# Patient Record
Sex: Female | Born: 1970
Health system: Southern US, Community
[De-identification: ages and names within clinical notes are randomized; demographics above are authoritative.]

## PROBLEM LIST (undated history)

## (undated) DIAGNOSIS — Z87440 Personal history of urinary (tract) infections: Secondary | ICD-10-CM

## (undated) DIAGNOSIS — K219 Gastro-esophageal reflux disease without esophagitis: Secondary | ICD-10-CM

## (undated) DIAGNOSIS — Z8619 Personal history of other infectious and parasitic diseases: Secondary | ICD-10-CM

## (undated) DIAGNOSIS — R011 Cardiac murmur, unspecified: Secondary | ICD-10-CM

## (undated) DIAGNOSIS — Z114 Encounter for screening for human immunodeficiency virus [HIV]: Secondary | ICD-10-CM

## (undated) DIAGNOSIS — Z9889 Other specified postprocedural states: Secondary | ICD-10-CM

## (undated) DIAGNOSIS — R112 Nausea with vomiting, unspecified: Secondary | ICD-10-CM

## (undated) DIAGNOSIS — M1990 Unspecified osteoarthritis, unspecified site: Secondary | ICD-10-CM

## (undated) DIAGNOSIS — T7840XA Allergy, unspecified, initial encounter: Secondary | ICD-10-CM

## (undated) HISTORY — PX: TONSILLECTOMY: SUR1361

## (undated) HISTORY — DX: Encounter for screening for human immunodeficiency virus (HIV): Z11.4

## (undated) HISTORY — DX: Gastro-esophageal reflux disease without esophagitis: K21.9

## (undated) HISTORY — PX: BREAST SURGERY: SHX581

## (undated) HISTORY — DX: Personal history of other infectious and parasitic diseases: Z86.19

## (undated) HISTORY — PX: MANDIBLE FRACTURE SURGERY: SHX706

## (undated) HISTORY — DX: Allergy, unspecified, initial encounter: T78.40XA

## (undated) HISTORY — DX: Personal history of urinary (tract) infections: Z87.440

## (undated) HISTORY — DX: Cardiac murmur, unspecified: R01.1

---

## 1998-08-07 HISTORY — PX: ENDOMETRIAL BIOPSY: SHX622

## 2007-04-08 ENCOUNTER — Ambulatory Visit (HOSPITAL_COMMUNITY): Admission: RE | Admit: 2007-04-08 | Discharge: 2007-04-08 | Payer: Self-pay | Admitting: Obstetrics and Gynecology

## 2008-05-31 ENCOUNTER — Encounter (INDEPENDENT_AMBULATORY_CARE_PROVIDER_SITE_OTHER): Payer: Self-pay | Admitting: Obstetrics and Gynecology

## 2008-05-31 ENCOUNTER — Ambulatory Visit (HOSPITAL_COMMUNITY): Admission: RE | Admit: 2008-05-31 | Discharge: 2008-05-31 | Payer: Self-pay | Admitting: Obstetrics and Gynecology

## 2009-02-06 HISTORY — PX: DILATION AND CURETTAGE OF UTERUS: SHX78

## 2010-01-27 ENCOUNTER — Encounter: Payer: Self-pay | Admitting: Obstetrics and Gynecology

## 2010-04-16 LAB — CBC
HCT: 40.3 % (ref 36.0–46.0)
Platelets: 277 10*3/uL (ref 150–400)
RDW: 12.3 % (ref 11.5–15.5)

## 2010-04-16 LAB — PREGNANCY, URINE: Preg Test, Ur: NEGATIVE

## 2010-05-21 NOTE — H&P (Signed)
[name], [name] NO.:  [id number]   MEDICAL RECORD NO.:  [id number]         PATIENT TYPE:  WAMB   LOCATION:                                FACILITY:  [facility]   PHYSICIAN:  Duke Salvia. Marcelle Overlie, M.D.DATE OF BIRTH:  August 13, 1970   DATE OF ADMISSION:  05/31/2008  DATE OF DISCHARGE:                              HISTORY & PHYSICAL   CHIEF COMPLAINT:  Pelvic pain, endometrial polyp.   HISTORY OF PRESENT ILLNESS:  A 40 year old G0, P0.  The patient was  referred from [name] where he had been evaluating her for  primary infertility.  His saline infusion ultrasound demonstrated an  endometrial polyp.  Additionally, she has pelvic pain and dyspareunia.  She has a family history with a sister with endometriosis.  She presents  now for D and C, hysteroscopy with diagnostic laparoscopy.  This  procedure including risks related to bleeding, infection, adjacent organ  injury, the possible need for open additional surgery all reviewed.   PAST MEDICAL HISTORY:   ALLERGIES:  SULFA.   OPERATIONS:  Breast augmentation, jaw surgery.   REVIEW OF SYSTEMS:  Significant for primary infertility, prior history  of Chlamydia treated in the past, and UTI.   FAMILY HISTORY:  Significant for arthritis, unspecified cancer, IVF, and  heart disease.   SOCIAL HISTORY:  Denies smoking or drug use.  One drink per day.  She is  married.   PHYSICAL EXAMINATION:  VITAL SIGNS:  Temperature 98.2, blood pressure  120/72.  HEENT:  Unremarkable.  NECK:  Supple without masses.  LUNGS:  Clear.  CARDIOVASCULAR:  Regular rate and rhythm without murmurs, rubs, or  gallops.  BREASTS:  Without masses.  ABDOMEN: Soft, flat, nontender.  PELVIC:  Normal external genitalia.  Vagina and cervix are clear.  Uterus midposition, normal size.  Adnexa negative.  No unusual  nodularity or tenderness.   IMPRESSION:  1. Endometrial polyp.  2. Pelvic pain, dyspareunia, rule out endometriosis.   PLAN:   D and C, hysteroscopy with diagnostic laparoscopy.  Procedure and  risks reviewed as above.      Richard M. Marcelle Overlie, M.D.  Electronically Signed     RMH/MEDQ  D:  05/26/2008  T:  05/27/2008  Job:  [id number]

## 2010-05-21 NOTE — Op Note (Signed)
[name], [name]             ACCOUNT NO.:  [id number]   MEDICAL RECORD NO.:  [id number]          PATIENT TYPE:  AMB   LOCATION:  SDC                           [facility]ACILITY:  [facility]   PHYSICIAN:  Duke Salvia. Marcelle Overlie, M.D.DATE OF BIRTH:  August 16, 1970   DATE OF PROCEDURE:  05/31/2008  DATE OF DISCHARGE:  05/31/2008                               OPERATIVE REPORT   PREOPERATIVE DIAGNOSES:  Abnormal uterine bleeding, endometrial polyp,  pelvic pain, primary infertility.   POSTOPERATIVE DIAGNOSES:  Abnormal uterine bleeding, endometrial polyp,  pelvic pain, primary infertility plus pelvic endometriosis.   PROCEDURE:  Dilation and curettage, hysteroscopy with removal of  endometrial polyp, tubal dye insufflation, diagnostic laparoscopy with  superficial coagulation of endometriosis.   SURGEON:  Duke Salvia. Marcelle Overlie, MD   ANESTHESIA:  General endotracheal.   COMPLICATIONS:  None.   DRAINS:  In-and-out Foley catheter.   BLOOD LOSS:  Minimal.   SPECIMENS:  Endometrial curettings.   PROCEDURE AND FINDINGS:  The patient taken to the operating room after  an adequate level of general endotracheal anesthesia was obtained with  the patient's legs in stirrups.  The abdomen, perineum, and vagina were  prepped and draped in usual manner for laparoscopy D and C.  Bladder was  drained.  EUA carried out.  Uterus midposition, normal size.  Adnexa  negative.  Weighted speculum was positioned with the legs extended.  The  anterior cervix was grasped with tenaculum, sounded to 8 cm,  progressively dilated to a 27 Pratt dilator.  The 4.5-mm diagnostic  hysteroscope was then used with continuous flow to perform hysteroscopy  revealing several endometrial polyps and a moderate amount of  endometrial tissue.  Sharp curettage was carried out along with  exploration with polyp forceps removing some polypoid-like tissue.  The  scope was reinserted.  The cavity was irrigated and noted to be clean.  The specimen  was sent to pathology.  Kahn cannula was positioned.  Attention then directed to the abdomen.   The subumbilical area was infiltrated with 0.25% Marcaine plain.  The  Veress needle was introduced without difficulty.  Its intra-abdominal  position was verified by pressure and water testing.  After 2-1/2 L  pneumoperitoneum was then created laparoscopic trocar and sleeve were  then introduced without difficulty.  There was no evidence of any  bleeding or trauma.  Three fingerbreadths above the symphysis in  midline.  A 5-mm trocar was inserted under direct visualization.  She  was placed in Trendelenburg and pelvic findings as follows.   The uterus itself was upper limit of normal size, normal serosal  contour.  The anterior bladder flap was unremarkable.  Right tube and  ovary were normal.  The left tube was normal with a delicate fimbriated  end on each side.   Remarkable findings were active pelvic endometriosis, blackish bluish  areas with inflammation on the posterior surface of the left ovary on  the left ovarian peritoneal fossa and just lateral to the uterosacral  ligament on the left side.  The right side was free and clear.  The  remainder of the cul-de-sac was  free and clear.  The appendix could not  be well identified, but there were no other upper abdominal findings.  Tubal dye studies were carried out prompt spillage on each side.  The  Nezhat was then used to irrigate and aspirate the fluid.  The bipolar  was then used to superficially coagulate the areas of endometriosis on  the posterior surface of the ovary in particular and superficially on  the implant just lateral to the uterosacral ligament.  This was done  superficially since it was near the course of the ureter which was  traced and felt to be below.  But again, this was superficial  coagulation only.  Nezhat was then used to remove the debris and  aspirated.  All operative sites were hemostatic.  She tolerated  this  well, went to recovery room in good condition.  After the instruments  were removed, gas allowed to escape.  Defects were closed with 4 Dexon  subcuticular sutures and Dermabond.      Richard M. Marcelle Overlie, M.D.  Electronically Signed     RMH/MEDQ  D:  05/31/2008  T:  06/01/2008  Job:  [id number]   cc:   Leatha Gilding. Mezer, M.D.  Fax: [phone]

## 2013-03-31 ENCOUNTER — Other Ambulatory Visit: Payer: Self-pay | Admitting: Obstetrics and Gynecology

## 2013-03-31 DIAGNOSIS — R928 Other abnormal and inconclusive findings on diagnostic imaging of breast: Secondary | ICD-10-CM

## 2013-04-08 ENCOUNTER — Ambulatory Visit
Admission: RE | Admit: 2013-04-08 | Discharge: 2013-04-08 | Disposition: A | Discharge status: Home / Self Care | Payer: BC Managed Care – PPO | Source: Ambulatory Visit | Attending: Obstetrics and Gynecology | Admitting: Obstetrics and Gynecology

## 2013-04-08 ENCOUNTER — Other Ambulatory Visit: Payer: Self-pay | Admitting: Obstetrics and Gynecology

## 2013-04-08 DIAGNOSIS — R928 Other abnormal and inconclusive findings on diagnostic imaging of breast: Secondary | ICD-10-CM

## 2013-04-08 DIAGNOSIS — R921 Mammographic calcification found on diagnostic imaging of breast: Secondary | ICD-10-CM

## 2013-04-15 ENCOUNTER — Ambulatory Visit
Admission: RE | Admit: 2013-04-15 | Discharge: 2013-04-15 | Disposition: A | Discharge status: Home / Self Care | Payer: BC Managed Care – PPO | Source: Ambulatory Visit | Attending: Obstetrics and Gynecology | Admitting: Obstetrics and Gynecology

## 2013-05-02 ENCOUNTER — Ambulatory Visit (INDEPENDENT_AMBULATORY_CARE_PROVIDER_SITE_OTHER): Payer: BC Managed Care – PPO | Admitting: General Surgery

## 2013-05-02 ENCOUNTER — Encounter (INDEPENDENT_AMBULATORY_CARE_PROVIDER_SITE_OTHER): Payer: Self-pay | Admitting: General Surgery

## 2013-05-02 VITALS — BP 116/78 | HR 80 | Temp 98.0°F | Resp 12 | Ht 68.0 in | Wt 145.0 lb

## 2013-05-02 DIAGNOSIS — R928 Other abnormal and inconclusive findings on diagnostic imaging of breast: Secondary | ICD-10-CM | POA: Insufficient documentation

## 2013-05-02 NOTE — Patient Instructions (Signed)
Your recent image guided biopsy of the left breast shows atypical cells, but not cancer.  We recommend that this area be conservatively excised to make sure that we're not missing an early breast cancer. Hopefully this is not the case.  You will be scheduled for a left lumpectomy with radioactive seed localization in the near future.       Lumpectomy A lumpectomy is a form of "breast conserving" or "breast preservation" surgery. It may also be referred to as a partial mastectomy. During a lumpectomy, the portion of the breast that contains the cancerous tumor or breast mass (the lump) is removed. Some normal tissue around the lump may also be removed to make sure all the tumor has been removed. This surgery should take 40 minutes or less. LET [facility] PROVID[facility] KNOW ABOUT:  Any allergies you have.  All medicines you are taking, including vitamins, herbs, eye drops, creams, and over-the-counter medicines.  Previous problems you or members of your family have had with the use of anesthetics.  Any blood disorders you have.  Previous surgeries you have had.  Medical conditions you have. RISKS AND COMPLICATIONS Generally, this is a safe procedure. However, as with any procedure, complications can occur. Possible complications include:  Bleeding.  Infection.  Pain.  Temporary swelling.  Change in the shape of the breast, particularly if a large portion is removed. BEFORE THE PROCEDURE  Ask your health care provider about changing or stopping your regular medicines.  Do not eat or drink anything for 7 8 hours before the surgery or as directed by your health care provider. Ask your health care provider if you can take a sip of water with any approved medicines.  On the day of surgery, your healthcare provider will use a mammogram or ultrasound to locate and mark the tumor in your breast. These markings on your breast will show where the cut (incision) will be  made. PROCEDURE   An IV tube will be put into one of your veins.  You may be given medicine to help you relax before the surgery (sedative). You will be given one of the following:  A medicine that numbs the area (local anesthesia).  A medicine that makes you go to sleep (general anesthesia).  Your health care provider will use a kind of electric scalpel that uses heat to minimize bleeding (electrocautery knife).  A curved incision (like a smile or frown) that follows the natural curve of your breast is made, to allow for minimal scarring and better healing.  The tumor will be removed with some of the surrounding tissue. This will be sent to the lab for analysis. Your health care provider may also remove your lymph nodes at this time if needed.  Sometimes, but not always, a rubber tube called a drain will be surgically inserted into your breast area or armpit to collect excess fluid that may accumulate in the space where the tumor was. This drain is connected to a plastic bulb on the outside of your body. This drain creates suction to help remove the fluid.  The incisions will be closed with stitches (sutures).  A bandage may be placed over the incisions. AFTER THE PROCEDURE  You will be taken to the recovery area.  You will be given medicine for pain.  A small rubber drain may be placed in the breast for 2 3 days to prevent a collection of blood (hematoma) from developing in the breast. You will be given instructions on  caring for the drain before you go home.  A pressure bandage (dressing) will be applied for 1 2 days to prevent bleeding. Ask your health care provider how to care for your bandage at home. Document Released: 02/03/2006 Document Revised: 08/25/2012 Document Reviewed: 05/28/2012 [facility] Patient Information 2014 [place].

## 2013-05-02 NOTE — Progress Notes (Signed)
Patient ID: Rhonda CohoJanell Fountain, female   DOB: 12/13/1970, 43 y.o.   MRN: [id number]  Chief Complaint  Patient presents with  . New Evaluation    eval Lt br atypica    HPI [name] is a 43 y.o. female.  She [facility] left breast, medial quadrant, with image guided biopsy showing flat epithelial atypia.  The patient has no prior history of breast problems she has had bilateral saline implants placed. She says these are subpectoral and [place]. She's had no problems and they been there for almost 15 years. She is followed by Dr. Chevis PrettyMezer. Bilateral mammograms were performed at his office recently. They show a new 7 mm area of suspicious calcifications in the left breast medially. Image guided biopsy shows flat epithelial atypia, reviewed by Dr. Frederica Kusterund and Dr. Colonel BaldKish.  The images are not retrievable on the computer today.  History reveals breast cancer paternal aunt age 44 with relapse and eventual death. Breast cancer in 2 great maternal aunts, postmenopausal no ovarian cancer.  Comorbidities are minimal. She quit smoking 2005. She is here with her husband today.  HPI  History reviewed. No pertinent past medical history.  Past Surgical History  Procedure Laterality Date  . Breast surgery      breast augmentation 08/1998  . Dilation and curettage of uterus  02/2009  . Endometrial biopsy  08/1998    Family History  Problem Relation Age of Onset  . Cancer Maternal Aunt     breast  . Cancer Paternal Aunt     breast  . Birth defects Paternal Grandfather     lung    Social History History  Substance Use Topics  . Smoking status: Former Smoker    Quit date: 05/03/2003  . Smokeless tobacco: Not on file  . Alcohol Use: Yes     Comment: social    Allergies  Allergen Reactions  . Sulfa Antibiotics Hives    Current Outpatient Prescriptions  Medication Sig Dispense Refill  . fexofenadine-pseudoephedrine  (ALLEGRA-D 24) 180-240 MG per 24 hr tablet Take 1 tablet by mouth daily.       No current facility-administered medications for this visit.    Review of Systems Review of Systems  Constitutional: Negative for fever, chills and unexpected weight change.  HENT: Negative for congestion, hearing loss, sore throat, trouble swallowing and voice change.   Eyes: Negative for visual disturbance.  Respiratory: Negative for cough and wheezing.   Cardiovascular: Negative for chest pain, palpitations and leg swelling.  Gastrointestinal: Negative for nausea, vomiting, abdominal pain, diarrhea, constipation, blood in stool, abdominal distention and anal bleeding.  Genitourinary: Negative for hematuria, vaginal bleeding and difficulty urinating.  Musculoskeletal: Negative for arthralgias.  Skin: Negative for rash and wound.  Neurological: Negative for seizures, syncope and headaches.  Hematological: Negative for adenopathy. Does not bruise/bleed easily.  Psychiatric/Behavioral: Negative for confusion.    Blood pressure 116/78, pulse 80, temperature 98 F (36.7 C), temperature source Temporal, resp. rate 12, height 5\' 8"  (1.727 m), weight 145 lb (65.772 kg), last menstrual period 03/12/2013.  Physical Exam Physical Exam  Constitutional: She is oriented to person, place, and time. She appears well-developed and well-nourished. No distress.  Alert. Thin. Fit.  HENT:  Head: Normocephalic and atraumatic.  Nose: Nose normal.  Mouth/Throat: No oropharyngeal exudate.  Eyes: Conjunctivae and EOM are normal. Pupils are equal, round, and reactive to light. Left eye exhibits no discharge.  No scleral icterus.  Neck: Neck supple. No JVD present. No tracheal deviation present. No thyromegaly present.  Cardiovascular: Normal rate, regular rhythm, normal heart sounds and intact distal pulses.   No murmur heard. Pulmonary/Chest: Effort normal and breath sounds normal. No respiratory distress. She has no wheezes.  She has no rales. She exhibits no tenderness.    Bilateral circumareolar incisions well healed. Bilateral implants in place. Small biopsy site left breast no. No hematoma. No palpable mass. No axillary adenopathy.  Abdominal: Soft. Bowel sounds are normal. She exhibits no distension and no mass. There is no tenderness. There is no rebound and no guarding.  Musculoskeletal: She exhibits no edema and no tenderness.  Lymphadenopathy:    She has no cervical adenopathy.  Neurological: She is alert and oriented to person, place, and time. She exhibits normal muscle tone. Coordination normal.  Skin: Skin is warm. No rash noted. She is not diaphoretic. No erythema. No pallor.  Psychiatric: She has a normal mood and affect. Her behavior is normal. Judgment and thought content normal.    Data Reviewed Mammograms and pathology report  Assessment    Abnormal mammogram left breast, medial, 7 mm calcifications. Atypical cells via biopsy.  Status post bilateral subpectoral implants by history  Second and third line relatives with breast cancer     Plan    Scheduled for conservative left lumpectomy with radioactive seed localization in the near future  I discussed the indications, details, techniques, and numerous risk of the surgery with the patient and her husband. They're aware of the risk of bleeding, infection, cosmetic deformity, injury to the implant requiring sacrifice, reoperation if this is cancer, and other unforeseen problems. She is aware of these issues. She understands all these issues. All their questions were answered. They both agree with this plan.        Angelia MouldHaywood M. Derrell LollingIngram, M.D., [facility] General and Minimally invasive Surgery Breast and Colorectal Surgery Office:   [phone] Pager:   [phone]  05/02/2013, 5:40 PM

## 2013-05-03 ENCOUNTER — Other Ambulatory Visit (INDEPENDENT_AMBULATORY_CARE_PROVIDER_SITE_OTHER): Payer: Self-pay | Admitting: General Surgery

## 2013-05-03 DIAGNOSIS — R928 Other abnormal and inconclusive findings on diagnostic imaging of breast: Secondary | ICD-10-CM

## 2013-05-10 NOTE — H&P (Signed)
Rhonda CohoJanell Tortorella   MRN:  [id number]   Description: 43 year old female  Provider: Ernestene MentionHaywood M Abrea Henle, MD  Department: [facility]              Diagnoses      Abnormal mammogram    -  Primary      793.80             Reason for Visit      New Evaluation      eval Lt br atypica               Current Vitals Most recent update: 05/02/2013  4:57 PM by Wynelle Clevelandonya Ayers, CMA      BP Pulse Temp(Src) Resp Ht Wt      116/78 80 98 F (36.7 C) (Temporal) 12 5\' 8"  (1.727 m) 145 lb (65.772 kg)      BMI 22.05 kg/m2 03/12/2013                    History and Physical    Ernestene MentionHaywood M Gustin Zobrist, MD      Status: Signed            Patient ID: Rhonda CohoJanell Venditto, female   DOB: 04/13/1970, 43 y.o.   MRN: [id number]            Note: This dictation was prepared with Dragon/digital dictation along with [facility]phrase technology. Any transcriptional errors that result from this process are unintentional.    HPI [name] is a 43 y.o. female.  [facility] left breast, medial quadrant, with image guided biopsy showing flat epithelial atypia.   The patient has no prior history of breast problems she has had bilateral saline implants placed. She says these are subpectoral and they were done in [place]. She's had no problems and they been there for almost 15 years. She is followed by Dr. Chevis PrettyMezer. Bilateral mammograms were performed at his office recently. They show a new 7 mm area of suspicious calcifications in the left breast medially. Image guided biopsy shows flat epithelial atypia, reviewed by Dr. Frederica Kusterund and Dr. Colonel BaldKish.  The images are not retrievable on the computer today.   History reveals breast cancer paternal aunt age 44 with relapse and eventual death. Breast cancer in 2 great maternal aunts, postmenopausal no ovarian cancer.   Comorbidities are minimal. She quit smoking 2005. She is here with her husband today.        History  reviewed. No pertinent past medical history.    Past Surgical History   Procedure  Laterality  Date   .  Breast surgery           breast augmentation 08/1998   .  Dilation and curettage of uterus    02/2009   .  Endometrial biopsy    08/1998         Family History   Problem  Relation  Age of Onset   .  Cancer  Maternal Aunt         breast   .  Cancer  Paternal Aunt         breast   .  Birth defects  Paternal Grandfather         lung        Social History History   Substance Use Topics   .  Smoking status:  Former Smoker       Quit date:  05/03/2003   .  Smokeless tobacco:  Not on file   .  Alcohol Use:  Yes         Comment: social         Allergies   Allergen  Reactions   .  Sulfa Antibiotics  Hives         Current Outpatient Prescriptions   Medication  Sig  Dispense  Refill   .  fexofenadine-pseudoephedrine (ALLEGRA-D 24) 180-240 MG per 24 hr tablet  Take 1 tablet by mouth daily.             No current facility-administered medications for this visit.        Review of Systems   Constitutional: Negative for fever, chills and unexpected weight change.  HENT: Negative for congestion, hearing loss, sore throat, trouble swallowing and voice change.   Eyes: Negative for visual disturbance.  Respiratory: Negative for cough and wheezing.   Cardiovascular: Negative for chest pain, palpitations and leg swelling.  Gastrointestinal: Negative for nausea, vomiting, abdominal pain, diarrhea, constipation, blood in stool, abdominal distention and anal bleeding.  Genitourinary: Negative for hematuria, vaginal bleeding and difficulty urinating.  Musculoskeletal: Negative for arthralgias.  Skin: Negative for rash and wound.  Neurological: Negative for seizures, syncope and headaches.  Hematological: Negative for adenopathy. Does not bruise/bleed easily.  Psychiatric/Behavioral: Negative for confusion.      Blood pressure 116/78, pulse 80, temperature 98 F (36.7  C), temperature source Temporal, resp. rate 12, height 5\' 8"  (1.727 m), weight 145 lb (65.772 kg), last menstrual period 03/12/2013.   Physical Exam   Constitutional: She is oriented to person, place, and time. She appears well-developed and well-nourished. No distress.  Alert. Thin. Fit.  HENT:   Head: Normocephalic and atraumatic.   Nose: Nose normal.   Mouth/Throat: No oropharyngeal exudate.  Eyes: Conjunctivae and EOM are normal. Pupils are equal, round, and reactive to light. Left eye exhibits no discharge. No scleral icterus.  Neck: Neck supple. No JVD present. No tracheal deviation present. No thyromegaly present.  Cardiovascular: Normal rate, regular rhythm, normal heart sounds and intact distal pulses.    No murmur heard. Pulmonary/Chest: Effort normal and breath sounds normal. No respiratory distress. She has no wheezes. She has no rales. She exhibits no tenderness.    Bilateral circumareolar incisions well healed. Bilateral implants in place. Small biopsy site left breast no. No hematoma. No palpable mass. No axillary adenopathy.  Abdominal: Soft. Bowel sounds are normal. She exhibits no distension and no mass. There is no tenderness. There is no rebound and no guarding.  Musculoskeletal: She exhibits no edema and no tenderness.  Lymphadenopathy:    She has no cervical adenopathy.  Neurological: She is alert and oriented to person, place, and time. She exhibits normal muscle tone. Coordination normal.  Skin: Skin is warm. No rash noted. She is not diaphoretic. No erythema. No pallor.  Psychiatric: She has a normal mood and affect. Her behavior is normal. Judgment and thought content normal.      Data Reviewed Mammograms and pathology report   Assessment    Abnormal mammogram left breast, medial, 7 mm calcifications. Atypical cells via biopsy.   Status post bilateral subpectoral implants by history   Second and third line relatives with breast cancer       Plan    Scheduled for conservative left lumpectomy with radioactive seed localization in the near future   I discussed the indications, details, techniques, and numerous risk of the surgery with  the patient and her husband. They're aware of the risk of bleeding, infection, cosmetic deformity, injury to the implant requiring sacrifice, reoperation if this is cancer, and other unforeseen problems. She is aware of these issues. She understands all these issues. All their questions were answered. They both agree with this plan.         Angelia MouldHaywood M. Derrell LollingIngram, M.D., [facility] General and Minimally invasive Surgery Breast and Colorectal Surgery Office:   [phone] Pager:   [phone]

## 2013-05-11 ENCOUNTER — Ambulatory Visit
Admission: RE | Admit: 2013-05-11 | Discharge: 2013-05-11 | Disposition: A | Discharge status: Home / Self Care | Payer: BC Managed Care – PPO | Source: Ambulatory Visit | Attending: General Surgery | Admitting: General Surgery

## 2013-05-11 ENCOUNTER — Encounter (HOSPITAL_BASED_OUTPATIENT_CLINIC_OR_DEPARTMENT_OTHER): Payer: Self-pay | Admitting: *Deleted

## 2013-05-11 DIAGNOSIS — R928 Other abnormal and inconclusive findings on diagnostic imaging of breast: Secondary | ICD-10-CM

## 2013-05-13 ENCOUNTER — Ambulatory Visit
Admission: RE | Admit: 2013-05-13 | Discharge: 2013-05-13 | Disposition: A | Discharge status: Home / Self Care | Payer: BC Managed Care – PPO | Source: Ambulatory Visit | Attending: General Surgery | Admitting: General Surgery

## 2013-05-13 ENCOUNTER — Encounter (HOSPITAL_BASED_OUTPATIENT_CLINIC_OR_DEPARTMENT_OTHER): Payer: Self-pay | Admitting: *Deleted

## 2013-05-13 ENCOUNTER — Encounter (HOSPITAL_BASED_OUTPATIENT_CLINIC_OR_DEPARTMENT_OTHER): Payer: BC Managed Care – PPO | Admitting: Anesthesiology

## 2013-05-13 ENCOUNTER — Ambulatory Visit (HOSPITAL_BASED_OUTPATIENT_CLINIC_OR_DEPARTMENT_OTHER): Payer: BC Managed Care – PPO | Admitting: Anesthesiology

## 2013-05-13 ENCOUNTER — Encounter (HOSPITAL_BASED_OUTPATIENT_CLINIC_OR_DEPARTMENT_OTHER)
Admission: RE | Disposition: A | Discharge status: Home / Self Care | Payer: Self-pay | Source: Ambulatory Visit | Attending: General Surgery

## 2013-05-13 ENCOUNTER — Ambulatory Visit (HOSPITAL_BASED_OUTPATIENT_CLINIC_OR_DEPARTMENT_OTHER)
Admission: RE | Admit: 2013-05-13 | Discharge: 2013-05-13 | Disposition: A | Discharge status: Home / Self Care | Payer: BC Managed Care – PPO | Source: Ambulatory Visit | Attending: General Surgery | Admitting: General Surgery

## 2013-05-13 DIAGNOSIS — Z882 Allergy status to sulfonamides status: Secondary | ICD-10-CM | POA: Insufficient documentation

## 2013-05-13 DIAGNOSIS — R928 Other abnormal and inconclusive findings on diagnostic imaging of breast: Secondary | ICD-10-CM

## 2013-05-13 DIAGNOSIS — Z87891 Personal history of nicotine dependence: Secondary | ICD-10-CM | POA: Insufficient documentation

## 2013-05-13 DIAGNOSIS — N62 Hypertrophy of breast: Secondary | ICD-10-CM | POA: Insufficient documentation

## 2013-05-13 DIAGNOSIS — R920 Mammographic microcalcification found on diagnostic imaging of breast: Secondary | ICD-10-CM | POA: Insufficient documentation

## 2013-05-13 DIAGNOSIS — Z803 Family history of malignant neoplasm of breast: Secondary | ICD-10-CM | POA: Insufficient documentation

## 2013-05-13 HISTORY — PX: BREAST LUMPECTOMY WITH RADIOACTIVE SEED LOCALIZATION: SHX6424

## 2013-05-13 HISTORY — DX: Other specified postprocedural states: R11.2

## 2013-05-13 HISTORY — DX: Other specified postprocedural states: Z98.890

## 2013-05-13 HISTORY — DX: Unspecified osteoarthritis, unspecified site: M19.90

## 2013-05-13 LAB — POCT HEMOGLOBIN-HEMACUE: Hemoglobin: 14.8 g/dL (ref 12.0–15.0)

## 2013-05-13 SURGERY — BREAST LUMPECTOMY WITH RADIOACTIVE SEED LOCALIZATION
Anesthesia: General | Site: Breast | Laterality: Left

## 2013-05-13 MED ORDER — CEFAZOLIN SODIUM-DEXTROSE 2-3 GM-% IV SOLR
INTRAVENOUS | Status: AC
  Filled 2013-05-13: qty 50

## 2013-05-13 MED ORDER — FENTANYL CITRATE 0.05 MG/ML IJ SOLN: 50.0000 ug | INTRAMUSCULAR | Status: DC | PRN

## 2013-05-13 MED ORDER — SCOPOLAMINE 1 MG/3DAYS TD PT72
MEDICATED_PATCH | TRANSDERMAL | Status: AC
  Filled 2013-05-13: qty 1

## 2013-05-13 MED ORDER — OXYCODONE HCL 5 MG/5ML PO SOLN: 5.0000 mg | Freq: Once | ORAL | Status: DC | PRN

## 2013-05-13 MED ORDER — FENTANYL CITRATE 0.05 MG/ML IJ SOLN
INTRAMUSCULAR | Status: AC
  Filled 2013-05-13: qty 6

## 2013-05-13 MED ORDER — LIDOCAINE HCL (CARDIAC) 20 MG/ML IV SOLN
INTRAVENOUS | Status: DC | PRN
  Administered 2013-05-13: 75 mg via INTRAVENOUS

## 2013-05-13 MED ORDER — MIDAZOLAM HCL 2 MG/2ML IJ SOLN
INTRAMUSCULAR | Status: AC
  Filled 2013-05-13: qty 2

## 2013-05-13 MED ORDER — MIDAZOLAM HCL 5 MG/5ML IJ SOLN
INTRAMUSCULAR | Status: DC | PRN
  Administered 2013-05-13: 2 mg via INTRAVENOUS

## 2013-05-13 MED ORDER — IBUPROFEN 600 MG PO TABS
600.0000 mg | ORAL_TABLET | Freq: Once | ORAL | Status: AC
  Administered 2013-05-13: 600 mg via ORAL

## 2013-05-13 MED ORDER — CHLORHEXIDINE GLUCONATE 4 % EX LIQD: 1.0000 "application " | Freq: Once | CUTANEOUS | Status: DC

## 2013-05-13 MED ORDER — BUPIVACAINE-EPINEPHRINE (PF) 0.5% -1:200000 IJ SOLN
INTRAMUSCULAR | Status: DC | PRN
  Administered 2013-05-13: 4.5 mL

## 2013-05-13 MED ORDER — LACTATED RINGERS IV SOLN
INTRAVENOUS | Status: DC
  Administered 2013-05-13: 08:00:00 via INTRAVENOUS
  Administered 2013-05-13: 10 mL/h via INTRAVENOUS

## 2013-05-13 MED ORDER — FENTANYL CITRATE 0.05 MG/ML IJ SOLN
INTRAMUSCULAR | Status: AC
  Filled 2013-05-13: qty 2

## 2013-05-13 MED ORDER — IBUPROFEN 200 MG PO TABS
ORAL_TABLET | ORAL | Status: AC
  Filled 2013-05-13: qty 3

## 2013-05-13 MED ORDER — HYDROCODONE-ACETAMINOPHEN 5-325 MG PO TABS: 1.0000 | ORAL_TABLET | Freq: Four times a day (QID) | ORAL | Status: DC | PRN

## 2013-05-13 MED ORDER — ONDANSETRON HCL 4 MG/2ML IJ SOLN
INTRAMUSCULAR | Status: DC | PRN
  Administered 2013-05-13: 4 mg via INTRAVENOUS

## 2013-05-13 MED ORDER — OXYCODONE HCL 5 MG PO TABS: 5.0000 mg | ORAL_TABLET | Freq: Once | ORAL | Status: DC | PRN

## 2013-05-13 MED ORDER — CEFAZOLIN SODIUM-DEXTROSE 2-3 GM-% IV SOLR
2.0000 g | INTRAVENOUS | Status: AC
  Administered 2013-05-13: 2 g via INTRAVENOUS

## 2013-05-13 MED ORDER — MIDAZOLAM HCL 2 MG/2ML IJ SOLN: 1.0000 mg | INTRAMUSCULAR | Status: DC | PRN

## 2013-05-13 MED ORDER — BUPIVACAINE-EPINEPHRINE (PF) 0.5% -1:200000 IJ SOLN
INTRAMUSCULAR | Status: AC
  Filled 2013-05-13: qty 30

## 2013-05-13 MED ORDER — PROPOFOL 10 MG/ML IV BOLUS
INTRAVENOUS | Status: DC | PRN
  Administered 2013-05-13: 200 mg via INTRAVENOUS

## 2013-05-13 MED ORDER — DEXAMETHASONE SODIUM PHOSPHATE 4 MG/ML IJ SOLN
INTRAMUSCULAR | Status: DC | PRN
  Administered 2013-05-13: 10 mg via INTRAVENOUS

## 2013-05-13 MED ORDER — ONDANSETRON HCL 4 MG/2ML IJ SOLN: 4.0000 mg | Freq: Four times a day (QID) | INTRAMUSCULAR | Status: DC | PRN

## 2013-05-13 MED ORDER — FENTANYL CITRATE 0.05 MG/ML IJ SOLN
INTRAMUSCULAR | Status: DC | PRN
  Administered 2013-05-13: 100 ug via INTRAVENOUS

## 2013-05-13 MED ORDER — FENTANYL CITRATE 0.05 MG/ML IJ SOLN
25.0000 ug | INTRAMUSCULAR | Status: DC | PRN
  Administered 2013-05-13: 25 ug via INTRAVENOUS

## 2013-05-13 SURGICAL SUPPLY — 65 items
APPLIER CLIP 9.375 MED OPEN (MISCELLANEOUS) ×3
BENZOIN TINCTURE PRP APPL 2/3 (GAUZE/BANDAGES/DRESSINGS) IMPLANT
BINDER BREAST LRG (GAUZE/BANDAGES/DRESSINGS) IMPLANT
BINDER BREAST MEDIUM (GAUZE/BANDAGES/DRESSINGS) IMPLANT
BINDER BREAST XLRG (GAUZE/BANDAGES/DRESSINGS) IMPLANT
BINDER BREAST XXLRG (GAUZE/BANDAGES/DRESSINGS) IMPLANT
BLADE 10 SAFETY STRL DISP (BLADE) IMPLANT
BLADE 15 SAFETY STRL DISP (BLADE) IMPLANT
BLADE HEX COATED 2.75 (ELECTRODE) ×3 IMPLANT
BLADE SURG 15 STRL LF DISP TIS (BLADE) ×1 IMPLANT
BLADE SURG 15 STRL SS (BLADE) ×2
CANISTER SUC SOCK COL 7IN (MISCELLANEOUS) IMPLANT
CANISTER SUCT 1200ML W/VALVE (MISCELLANEOUS) ×3 IMPLANT
CHLORAPREP W/TINT 26ML (MISCELLANEOUS) ×3 IMPLANT
CLIP APPLIE 9.375 MED OPEN (MISCELLANEOUS) ×1 IMPLANT
CLOSURE WOUND 1/2 X4 (GAUZE/BANDAGES/DRESSINGS)
COVER MAYO STAND STRL (DRAPES) ×3 IMPLANT
COVER PROBE W GEL 5X96 (DRAPES) ×3 IMPLANT
COVER TABLE BACK 60X90 (DRAPES) ×3 IMPLANT
DECANTER SPIKE VIAL GLASS SM (MISCELLANEOUS) IMPLANT
DERMABOND ADVANCED (GAUZE/BANDAGES/DRESSINGS) ×2
DERMABOND ADVANCED .7 DNX12 (GAUZE/BANDAGES/DRESSINGS) ×1 IMPLANT
DEVICE DUBIN W/COMP PLATE 8390 (MISCELLANEOUS) ×3 IMPLANT
DRAIN CHANNEL 19F RND (DRAIN) IMPLANT
DRAPE LAPAROSCOPIC ABDOMINAL (DRAPES) ×3 IMPLANT
DRAPE UTILITY XL STRL (DRAPES) ×3 IMPLANT
DRSG PAD ABDOMINAL 8X10 ST (GAUZE/BANDAGES/DRESSINGS) IMPLANT
ELECT REM PT RETURN 9FT ADLT (ELECTROSURGICAL) ×3
ELECTRODE REM PT RTRN 9FT ADLT (ELECTROSURGICAL) ×1 IMPLANT
EVACUATOR SILICONE 100CC (DRAIN) IMPLANT
GLOVE BIOGEL PI IND STRL 6.5 (GLOVE) ×1 IMPLANT
GLOVE BIOGEL PI INDICATOR 6.5 (GLOVE) ×2
GLOVE ECLIPSE 6.5 STRL STRAW (GLOVE) ×3 IMPLANT
GLOVE EUDERMIC 7 POWDERFREE (GLOVE) ×6 IMPLANT
GLOVE EXAM NITRILE EXT CUFF MD (GLOVE) ×3 IMPLANT
GOWN STRL REUS W/ TWL LRG LVL3 (GOWN DISPOSABLE) ×1 IMPLANT
GOWN STRL REUS W/ TWL XL LVL3 (GOWN DISPOSABLE) ×1 IMPLANT
GOWN STRL REUS W/TWL LRG LVL3 (GOWN DISPOSABLE) ×2
GOWN STRL REUS W/TWL XL LVL3 (GOWN DISPOSABLE) ×2
KIT MARKER MARGIN INK (KITS) ×3 IMPLANT
NEEDLE HYPO 25X1 1.5 SAFETY (NEEDLE) ×3 IMPLANT
NS IRRIG 1000ML POUR BTL (IV SOLUTION) ×3 IMPLANT
PACK BASIN DAY SURGERY FS (CUSTOM PROCEDURE TRAY) ×3 IMPLANT
PENCIL BUTTON HOLSTER BLD 10FT (ELECTRODE) ×3 IMPLANT
PIN SAFETY STERILE (MISCELLANEOUS) IMPLANT
SHEET MEDIUM DRAPE 40X70 STRL (DRAPES) IMPLANT
SLEEVE SCD COMPRESS KNEE MED (MISCELLANEOUS) ×3 IMPLANT
SPONGE GAUZE 4X4 12PLY STER LF (GAUZE/BANDAGES/DRESSINGS) IMPLANT
SPONGE LAP 18X18 X RAY DECT (DISPOSABLE) IMPLANT
SPONGE LAP 4X18 X RAY DECT (DISPOSABLE) ×3 IMPLANT
STRIP CLOSURE SKIN 1/2X4 (GAUZE/BANDAGES/DRESSINGS) IMPLANT
SUT ETHILON 3 0 FSL (SUTURE) IMPLANT
SUT MNCRL AB 4-0 PS2 18 (SUTURE) ×3 IMPLANT
SUT SILK 2 0 SH (SUTURE) ×3 IMPLANT
SUT VIC AB 2-0 CT1 27 (SUTURE)
SUT VIC AB 2-0 CT1 TAPERPNT 27 (SUTURE) IMPLANT
SUT VIC AB 3-0 SH 27 (SUTURE)
SUT VIC AB 3-0 SH 27X BRD (SUTURE) IMPLANT
SUT VICRYL 3-0 CR8 SH (SUTURE) ×3 IMPLANT
SYR CONTROL 10ML LL (SYRINGE) ×3 IMPLANT
TOWEL OR 17X24 6PK STRL BLUE (TOWEL DISPOSABLE) ×3 IMPLANT
TOWEL OR NON WOVEN STRL DISP B (DISPOSABLE) ×3 IMPLANT
TUBE CONNECTING 20'X1/4 (TUBING) ×1
TUBE CONNECTING 20X1/4 (TUBING) ×2 IMPLANT
YANKAUER SUCT BULB TIP NO VENT (SUCTIONS) ×3 IMPLANT

## 2013-05-13 NOTE — Transfer of Care (Signed)
Immediate Anesthesia Transfer of Care Note  Patient: [name]  Procedure(s) Performed: Procedure(s): BREAST LUMPECTOMY WITH RADIOACTIVE SEED LOCALIZATION (Left)  Patient Location: PACU  Anesthesia Type:General  Level of Consciousness: awake, alert  and oriented  Airway & Oxygen Therapy: Patient Spontanous Breathing and Patient connected to face mask oxygen  Post-op Assessment: Report given to PACU RN and Post -op Vital signs reviewed and stable  Post vital signs: Reviewed and stable  Complications: No apparent anesthesia complications

## 2013-05-13 NOTE — Discharge Instructions (Signed)
[facility] °Office Phone Number [phone] ° °BREAST BIOPSY/ PARTIAL MASTECTOMY: POST OP INSTRUCTIONS ° °Always review your discharge instruction sheet given to you by the facility where your surgery was performed. ° °IF YOU HAVE DISABILITY OR FAMILY LEAVE FORMS, YOU MUST BRING THEM TO THE OFFICE FOR PROCESSING.  DO NOT GIVE THEM TO YOUR DOCTOR. ° °1. A prescription for pain medication may be given to you upon discharge.  Take your pain medication as prescribed, if needed.  If narcotic pain medicine is not needed, then you may take acetaminophen (Tylenol) or ibuprofen (Advil) as needed. °2. Take your usually prescribed medications unless otherwise directed °3. If you need a refill on your pain medication, please contact your pharmacy.  They will contact our office to request authorization.  Prescriptions will not be filled after 5pm or on week-ends. °4. You should eat very light the first 24 hours after surgery, such as soup, crackers, pudding, etc.  Resume your normal diet the day after surgery. °5. Most patients will experience some swelling and bruising in the breast.  Ice packs and a good support bra will help.  Swelling and bruising can take several days to resolve.  °6. It is common to experience some constipation if taking pain medication after surgery.  Increasing fluid intake and taking a stool softener will usually help or prevent this problem from occurring.  A mild laxative (Milk of Magnesia or Miralax) should be taken according to package directions if there are no bowel movements after 48 hours. °7. Unless discharge instructions indicate otherwise, you may remove your bandages 24-48 hours after surgery, and you may shower at that time.  You may have steri-strips (small skin tapes) in place directly over the incision.  These strips should be left on the skin for 7-10 days.  If your surgeon used skin glue on the incision, you may shower in 24 hours.  The glue will flake off over the  next 2-3 weeks.  Any sutures or staples will be removed at the office during your follow-up visit. °8. ACTIVITIES:  You may resume regular daily activities (gradually increasing) beginning the next day.  Wearing a good support bra or sports bra minimizes pain and swelling.  You may have sexual intercourse when it is comfortable. °a. You may drive when you no longer are taking prescription pain medication, you can comfortably wear a seatbelt, and you can safely maneuver your car and apply brakes. °b. RETURN TO WORK:  ______________________________________________________________________________________ °9. You should see your doctor in the office for a follow-up appointment approximately two weeks after your surgery.  Your doctor’s nurse will typically make your follow-up appointment when she calls you with your pathology report.  Expect your pathology report 2-3 business days after your surgery.  You may call to check if you do not hear from us after three days. °10. OTHER INSTRUCTIONS: _______________________________________________________________________________________________ _____________________________________________________________________________________________________________________________________ °_____________________________________________________________________________________________________________________________________ °_____________________________________________________________________________________________________________________________________ ° °WHEN TO CALL YOUR DOCTOR: °1. Fever over 101.0 °2. Nausea and/or vomiting. °3. Extreme swelling or bruising. °4. Continued bleeding from incision. °5. Increased pain, redness, or drainage from the incision. ° °The clinic staff is available to answer your questions during regular business hours.  Please don’t hesitate to call and ask to speak to one of the nurses for clinical concerns.  If you have a medical emergency, go to the nearest  emergency room or call 911.  A surgeon from [facility] is always on call at the hospital. ° °For further questions, please visit [email or web address]  ° ° °  Post Anesthesia Home Care Instructions ° °Activity: °Get plenty of rest for the remainder of the day. A responsible adult should stay with you for 24 hours following the procedure.  °For the next 24 hours, DO NOT: °-Drive a car °-Operate machinery °-Drink alcoholic beverages °-Take any medication unless instructed by your physician °-Make any legal decisions or sign important papers. ° °Meals: °Start with liquid foods such as gelatin or soup. Progress to regular foods as tolerated. Avoid greasy, spicy, heavy foods. If nausea and/or vomiting occur, drink only clear liquids until the nausea and/or vomiting subsides. Call your physician if vomiting continues. ° °Special Instructions/Symptoms: °Your throat may feel dry or sore from the anesthesia or the breathing tube placed in your throat during surgery. If this causes discomfort, gargle with warm salt water. The discomfort should disappear within 24 hours. ° °

## 2013-05-13 NOTE — Interval H&P Note (Signed)
History and Physical Interval Note:  05/13/2013 7:31 AM  [name]  has presented today for surgery, with the diagnosis of abnormal mammogram left breast   The goals and the various methods of treatment have been discussed with the patient and family. After consideration of risks, benefits and other options for treatment, the patient has consented to  Procedure(s): BREAST LUMPECTOMY WITH RADIOACTIVE SEED LOCALIZATION (Left) as a surgical intervention .  The patient's history has been reviewed, patient examined today, no change in status, stable for surgery.  I have reviewed the patient's chart and labs.  Questions were answered to the patient's satisfaction.     [name]

## 2013-05-13 NOTE — Anesthesia Preprocedure Evaluation (Signed)
Anesthesia Evaluation  Patient identified by MRN, date of birth, ID band Patient awake    Reviewed: Allergy & Precautions, H&P , NPO status , Patient's Chart, lab work & pertinent test results  History of Anesthesia Complications (+) PONV  Airway Mallampati: II  Neck ROM: full    Dental   Pulmonary former smoker,          Cardiovascular negative cardio ROS      Neuro/Psych    GI/Hepatic   Endo/Other    Renal/GU      Musculoskeletal  (+) Arthritis -,   Abdominal   Peds  Hematology   Anesthesia Other Findings   Reproductive/Obstetrics                           Anesthesia Physical Anesthesia Plan  ASA: II  Anesthesia Plan: General   Post-op Pain Management:    Induction: Intravenous  Airway Management Planned: LMA  Additional Equipment:   Intra-op Plan:   Post-operative Plan:   Informed Consent: I have reviewed the patients History and Physical, chart, labs and discussed the procedure including the risks, benefits and alternatives for the proposed anesthesia with the patient or authorized representative who has indicated his/her understanding and acceptance.     Plan Discussed with: CRNA, Anesthesiologist and Surgeon  Anesthesia Plan Comments:         Anesthesia Quick Evaluation

## 2013-05-13 NOTE — Anesthesia Procedure Notes (Signed)
Procedure Name: LMA Insertion Date/Time: 05/13/2013 8:21 AM Performed by: Zenia ResidesPAYNE, Pratham Cassatt D Pre-anesthesia Checklist: Patient identified, Emergency Drugs available, Suction available and Patient being monitored Patient Re-evaluated:Patient Re-evaluated prior to inductionOxygen Delivery Method: Circle System Utilized Preoxygenation: Pre-oxygenation with 100% oxygen Intubation Type: IV induction Ventilation: Mask ventilation without difficulty LMA: LMA inserted LMA Size: 4.0 Number of attempts: 1 Airway Equipment and Method: bite block Placement Confirmation: positive ETCO2 Tube secured with: Tape Dental Injury: Teeth and Oropharynx as per pre-operative assessment

## 2013-05-13 NOTE — Anesthesia Postprocedure Evaluation (Signed)
Anesthesia Post Note  Patient: [name]  Procedure(s) Performed: Procedure(s) (LRB): BREAST LUMPECTOMY WITH RADIOACTIVE SEED LOCALIZATION (Left)  Anesthesia type: General  Patient location: PACU  Post pain: Pain level controlled and Adequate analgesia  Post assessment: Post-op Vital signs reviewed, Patient's Cardiovascular Status Stable, Respiratory Function Stable, Patent Airway and Pain level controlled  Last Vitals:  Filed Vitals:   05/13/13 0945  BP: 134/80  Pulse: 57  Temp:   Resp: 17    Post vital signs: Reviewed and stable  Level of consciousness: awake, alert  and oriented  Complications: No apparent anesthesia complications

## 2013-05-13 NOTE — Op Note (Signed)
Patient Name:           [name]   Date of Surgery:        05/13/2013   Note: This dictation was prepared with Dragon/digital dictation along with [facility] technology. Any transcriptional errors that result from this process are unintentional.  Pre op Diagnosis:      Suspicious microcalcifications left breast, upper inner quadrant, Flat epithelial atypia on image guided biopsy  Post op Diagnosis:    Same  Procedure:                 Left lumpectomy with radioactivity localization and margin assessment  Surgeon:                     Angelia MouldHaywood M. Derrell LollingIngram, M.D., FACS  Assistant:                      None  Operative Indications:   [name] is a 43 y.o. female. She is [facility]ia Beltrew Davis [facility] abnormal mammogram left breast, medial quadrant, with image guided biopsy showing flat epithelial atypia.  The patient has no prior history of breast problems she has had bilateral saline implants placed. She says these are subpectoral [place] She's had no problems and they been there for almost 15 years. She is followed by Dr. Chevis PrettyMezer. Bilateral mammograms were performed at his office recently. They show a new 7 mm area of suspicious calcifications in the left breast medially. Image guided biopsy shows flat epithelial atypia, reviewed by Dr. Frederica Kusterund and Dr. Colonel BaldKish.  History reveals breast cancer paternal aunt age 44 with relapse and eventual death. Breast cancer in 2 great maternal aunts, postmenopausal no ovarian cancer. She underwent radioactive seed localization a few days ago and this operated upon electively   Operative Findings:       The radioactive seed was extremely superficial, basically being embedded in the dermis. This made the surgery technically somewhat more difficult and required a thin ellipse of skin to be excised around the seed. The radioactive seed and the previously placed biopsy marker clip were contained within the specimen, and we  felt we had an adequate conservative biopsy.  Procedure in Detail:          Following the induction of general endotracheal anesthesia a surgical time out was performed, the left breast was prepped and draped in a sterile fashion, and intravenous antibiotics were given. 0.5% Marcaine with epinephrine was used in the most superficial tissues. The neoprobe was used to isolate an area of maximum radioactivity in the upper inner quadrant about 3 cm peripheral to the areolar margin. I made a curvilinear, circumareolar incision over the radioactivity and  dissected down into the subcutaneous tissue. Using the neoprobe I found that the seed was embedded in the dermis. I then made a very thin skin incision superiorly to take a thin ellipse of skin so as not to dislodge the seed. I then dissected down into the breast tissue and around the somewhat fibrotic tissue using the neoprobe to guide my dissection. The specimen was removed. The anterior margin is  the skin, the superior margin was marked with a short suture and the posterior margin was marked with a double suture. Specimen mammogram showed that the biopsy marker and the radioactive seed within the specimen. All the radioactivity was in the specimen. There was no residual radioactivity within the breast. A single seed  was seen. Hemostasis was excellent. There was irrigated. The breast tissues and dermis were closed with interrupted sutures of 3-0 Vicryl and the skin was closed with a running subcuticular suture of 4-0 Monocryl and Dermabond. Patient tolerated the procedure well taken to PACU in stable condition. EBL 5 cc. Counts correct. Complications none.     Angelia MouldHaywood M. Derrell LollingIngram, M.D., FACS General and Minimally Invasive Surgery Breast and Colorectal Surgery  05/13/2013 9:01 AM

## 2013-05-16 NOTE — Progress Notes (Signed)
Quick Note:  Inform patient of Pathology report,.Good news. Benign  hmi ______

## 2013-05-17 ENCOUNTER — Encounter (HOSPITAL_BASED_OUTPATIENT_CLINIC_OR_DEPARTMENT_OTHER): Payer: Self-pay | Admitting: General Surgery

## 2013-05-17 ENCOUNTER — Telehealth (INDEPENDENT_AMBULATORY_CARE_PROVIDER_SITE_OTHER): Payer: Self-pay | Admitting: General Surgery

## 2013-05-17 NOTE — Telephone Encounter (Signed)
Called patient to let her know that her path report was good news. Benign

## 2013-06-23 ENCOUNTER — Encounter (INDEPENDENT_AMBULATORY_CARE_PROVIDER_SITE_OTHER): Payer: Self-pay | Admitting: General Surgery

## 2013-06-23 ENCOUNTER — Ambulatory Visit (INDEPENDENT_AMBULATORY_CARE_PROVIDER_SITE_OTHER): Payer: BC Managed Care – PPO | Admitting: General Surgery

## 2013-06-23 VITALS — BP 122/80 | HR 68 | Temp 98.5°F | Ht 68.0 in | Wt 148.0 lb

## 2013-06-23 DIAGNOSIS — R928 Other abnormal and inconclusive findings on diagnostic imaging of breast: Secondary | ICD-10-CM

## 2013-06-23 NOTE — Patient Instructions (Addendum)
You are recovering from your left breast lumpectomy without any significant surgical problems.  The final pathology is completely benign, and so nothing further needs to be done.  This finding does not increase her risk of breast cancer.  Annual mammography and annual physician breast exam is recommended  Return to see Dr. Derrell LollingIngram if necessary.

## 2013-06-23 NOTE — Progress Notes (Signed)
Patient ID: Rhonda CohoJanell Bonny, female   DOB: 02/23/1970, 43 y.o.   MRN: [id number] History: This patient underwent left partial mastectomy with radioactive seed localization for focal calcifications on May 8. She is recovering uneventfully. Final pathology showed benign breast tissue, prior biopsy site is identified.  Exam: Patient is in no distress Left breast wound seems to be healing uneventfully  Assessment: Suspicious microcalcifications left breast, proven to be benign on excisional biopsy.  Plan:  wound care discussed Annual mammography Annual physician exam Return to see me if necessary   [facility] M. Derrell LollingIngram, M.D., [facility]ACS [facility] General and Minimally invasive Surgery Breast and Colorectal Surgery Office:   [phone] Pager:   [phone]

## 2013-08-10 ENCOUNTER — Other Ambulatory Visit: Payer: Self-pay | Admitting: Gynecology

## 2013-08-12 LAB — CYTOLOGY - PAP

## 2014-08-22 ENCOUNTER — Other Ambulatory Visit: Payer: Self-pay | Admitting: Gynecology

## 2014-08-23 LAB — CYTOLOGY - PAP

## 2014-10-07 ENCOUNTER — Emergency Department (HOSPITAL_BASED_OUTPATIENT_CLINIC_OR_DEPARTMENT_OTHER): Payer: BLUE CROSS/BLUE SHIELD

## 2014-10-07 ENCOUNTER — Emergency Department (HOSPITAL_BASED_OUTPATIENT_CLINIC_OR_DEPARTMENT_OTHER)
Admission: EM | Admit: 2014-10-07 | Discharge: 2014-10-07 | Disposition: A | Discharge status: Home / Self Care | Payer: BLUE CROSS/BLUE SHIELD | Attending: Emergency Medicine | Admitting: Emergency Medicine

## 2014-10-07 ENCOUNTER — Other Ambulatory Visit: Payer: Self-pay

## 2014-10-07 ENCOUNTER — Encounter (HOSPITAL_BASED_OUTPATIENT_CLINIC_OR_DEPARTMENT_OTHER): Payer: Self-pay | Admitting: Emergency Medicine

## 2014-10-07 DIAGNOSIS — M1990 Unspecified osteoarthritis, unspecified site: Secondary | ICD-10-CM | POA: Diagnosis not present

## 2014-10-07 DIAGNOSIS — J40 Bronchitis, not specified as acute or chronic: Secondary | ICD-10-CM | POA: Diagnosis not present

## 2014-10-07 DIAGNOSIS — Z79899 Other long term (current) drug therapy: Secondary | ICD-10-CM | POA: Diagnosis not present

## 2014-10-07 DIAGNOSIS — H748X3 Other specified disorders of middle ear and mastoid, bilateral: Secondary | ICD-10-CM | POA: Insufficient documentation

## 2014-10-07 DIAGNOSIS — Z792 Long term (current) use of antibiotics: Secondary | ICD-10-CM | POA: Diagnosis not present

## 2014-10-07 DIAGNOSIS — R0602 Shortness of breath: Secondary | ICD-10-CM | POA: Diagnosis present

## 2014-10-07 DIAGNOSIS — Z87891 Personal history of nicotine dependence: Secondary | ICD-10-CM | POA: Insufficient documentation

## 2014-10-07 MED ORDER — PROMETHAZINE-DM 6.25-15 MG/5ML PO SYRP: 5.0000 mL | ORAL_SOLUTION | Freq: Every evening | ORAL | Status: DC | PRN

## 2014-10-07 NOTE — ED Notes (Signed)
Pt was seen at pcp 10 days ago dx with bronchitis, today followed up with pcp and symptoms remain after antibiotics and steroids, sent here for cxr

## 2014-10-07 NOTE — ED Provider Notes (Addendum)
CSN: [id number]     Arrival date & time 10/07/14  1050 History   First MD Initiated Contact with Patient 10/07/14 1101     Chief Complaint  Patient presents with  . Shortness of Breath     (Consider location/radiation/quality/duration/timing/severity/associated sxs/prior Treatment) Patient is a 44 y.o. female presenting with shortness of breath. The history is provided by the patient.  Shortness of Breath Severity:  Moderate Onset quality:  Gradual Duration:  1 month Timing:  Constant Progression:  Worsening Chronicity:  New Context: URI   Context comment:  Started with allergies and cold approximately one month ago and is just persisted with an ongoing cough, occasional wheezing and shortness of breath with exertion Relieved by:  Nothing Worsened by:  Activity Ineffective treatments: She completed a course of steroid and amoxicillin last week without improvement. Associated symptoms: cough, sputum production and wheezing   Associated symptoms: no abdominal pain, no chest pain, no fever and no sore throat   Associated symptoms comment:  Nasal congestion and ear pressure Risk factors comment:  No smoking or asthma history.  does have allergies and takes allegra   Past Medical History  Diagnosis Date  . Arthritis     osteo arthritist  . PONV (postoperative nausea and vomiting)    Past Surgical History  Procedure Laterality Date  . Breast surgery      breast augmentation 08/1998  . Dilation and curettage of uterus  02/2009  . Endometrial biopsy  08/1998  . Tonsillectomy      1970  . Mandible fracture surgery      1988  . Breast lumpectomy with radioactive seed localization Left 05/13/2013    Procedure: BREAST LUMPECTOMY WITH RADIOACTIVE SEED LOCALIZATION;  Surgeon: Ernestene Mention, MD;  Location: [facility];  Service: General;  Laterality: Left;   Family History  Problem Relation Age of Onset  . Cancer Maternal Aunt     breast  . Cancer Paternal Aunt    breast  . Birth defects Paternal Grandfather     lung   Social History  Substance Use Topics  . Smoking status: Former Smoker    Quit date: 05/03/2003  . Smokeless tobacco: None  . Alcohol Use: Yes     Comment: social   OB History    No data available     Review of Systems  Constitutional: Negative for fever.  HENT: Negative for sore throat.   Respiratory: Positive for cough, sputum production, shortness of breath and wheezing.   Cardiovascular: Negative for chest pain.  Gastrointestinal: Negative for abdominal pain.  All other systems reviewed and are negative.     Allergies  Sulfa antibiotics  Home Medications   Prior to Admission medications   Medication Sig Start Date End Date Taking? Authorizing Provider  amoxicillin-clavulanate (AUGMENTIN) 250-125 MG tablet Take 1 tablet by mouth 3 (three) times daily.   Yes Historical Provider, MD  predniSONE (DELTASONE) 5 MG tablet Take 5 mg by mouth daily with breakfast.   Yes Historical Provider, MD  fexofenadine (ALLEGRA) 180 MG tablet Take 180 mg by mouth daily.    Historical Provider, MD  Nutritional Supplements (HIGH-PROTEIN NUTRITIONAL SHAKE PO) Take by mouth.    Historical Provider, MD   BP 147/86 mmHg  Pulse 78  Temp(Src) 99.3 F (37.4 C) (Oral)  Resp 18  Ht  (1.753 m)  Wt 146 lb (66.225 kg)  BMI 21.55 kg/m2  SpO2 99%  LMP 09/23/2014 Physical Exam  Constitutional: She is oriented  to person, place, and time. She appears well-developed and well-nourished. No distress.  HENT:  Head: Normocephalic and atraumatic.  Right Ear: A middle ear effusion is present.  Left Ear: A middle ear effusion is present.  Nose: Mucosal edema present.  Mouth/Throat: Mucous membranes are normal. No oropharyngeal exudate, posterior oropharyngeal edema, posterior oropharyngeal erythema or tonsillar abscesses.  Eyes: EOM are normal. Pupils are equal, round, and reactive to light.  Cardiovascular: Normal rate, regular rhythm, normal  heart sounds and intact distal pulses.  Exam reveals no friction rub.   No murmur heard. Pulmonary/Chest: Effort normal and breath sounds normal. She has no wheezes. She has no rales.  Abdominal: Soft. Bowel sounds are normal. She exhibits no distension. There is no tenderness. There is no rebound and no guarding.  Musculoskeletal: Normal range of motion. She exhibits no tenderness.  No edema  Lymphadenopathy:    She has no cervical adenopathy.  Neurological: She is alert and oriented to person, place, and time. No cranial nerve deficit.  Skin: Skin is warm and dry. No rash noted.  Psychiatric: She has a normal mood and affect. Her behavior is normal.  Nursing note and vitals reviewed.   ED Course  Procedures (including critical care time) Labs Review Labs Reviewed - No data to display  Imaging Review Dg Chest 2 View  10/07/2014   CLINICAL DATA:  Short of breath.  EXAM: CHEST  2 VIEW  COMPARISON:  None.  FINDINGS: The heart size and mediastinal contours are within normal limits. Both lungs are clear. The visualized skeletal structures are unremarkable.  IMPRESSION: No active cardiopulmonary disease.   Electronically Signed   By: Marlan Palau M.D.   On: 10/07/2014 11:29   I have personally reviewed and evaluated these images and lab results as part of my medical decision-making.   EKG Interpretation   Date/Time:  Saturday October 07 2014 10:59:53 EDT Ventricular Rate:  74 PR Interval:  154 QRS Duration: 84 QT Interval:  354 QTC Calculation: 392 R Axis:   72 Text Interpretation:  Normal sinus rhythm with sinus arrhythmia Right  atrial enlargement Cannot rule out Anterior infarct , age undetermined No  previous tracing Confirmed by Anitra Lauth  MD, Crimson Dubberly ([id number]) on 10/07/2014  11:02:35 AM      MDM   Final diagnoses:  Bronchitis    Pt with symptoms consistent with viral bronchitis for 1 month. Seen at urgent care and just completed a course of amoxicillin and prednisone  without improvement. She went back today and they gave her an albuterol inhaler and a Z-Pak. 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 Unknown when her last tetanus shot was so unclear if she is vaccinated against pertussis however the cough is not characteristic of pertussis. Well appearing here.  No signs of breathing difficulty but states that she does become short of breath with exertion and coughing.  No signs of pharyngitis, otitis or abnormal abdominal findings.   CXR wnl encourage patient to use the Z-Pak and try the inhaler and follow-up with her doctor if symptoms do not improve.   Gwyneth Sprout, MD 10/07/14 1139  Gwyneth Sprout, MD 10/07/14 [phone]

## 2016-09-23 LAB — RESULTS CONSOLE HPV: CHL HPV: NEGATIVE

## 2016-09-23 LAB — HM PAP SMEAR: HM Pap smear: NEGATIVE

## 2017-02-21 DIAGNOSIS — B9689 Other specified bacterial agents as the cause of diseases classified elsewhere: Secondary | ICD-10-CM | POA: Diagnosis not present

## 2017-02-21 DIAGNOSIS — J019 Acute sinusitis, unspecified: Secondary | ICD-10-CM | POA: Diagnosis not present

## 2017-05-01 DIAGNOSIS — N959 Unspecified menopausal and perimenopausal disorder: Secondary | ICD-10-CM | POA: Diagnosis not present

## 2017-05-14 DIAGNOSIS — Z3009 Encounter for other general counseling and advice on contraception: Secondary | ICD-10-CM | POA: Diagnosis not present

## 2017-07-08 DIAGNOSIS — J0110 Acute frontal sinusitis, unspecified: Secondary | ICD-10-CM | POA: Diagnosis not present

## 2017-11-06 DIAGNOSIS — Z6822 Body mass index (BMI) 22.0-22.9, adult: Secondary | ICD-10-CM | POA: Diagnosis not present

## 2017-11-06 DIAGNOSIS — Z01419 Encounter for gynecological examination (general) (routine) without abnormal findings: Secondary | ICD-10-CM | POA: Diagnosis not present

## 2017-11-06 DIAGNOSIS — Z1231 Encounter for screening mammogram for malignant neoplasm of breast: Secondary | ICD-10-CM | POA: Diagnosis not present

## 2017-11-09 DIAGNOSIS — Z800 Family history of malignant neoplasm of digestive organs: Secondary | ICD-10-CM | POA: Diagnosis not present

## 2017-11-09 DIAGNOSIS — Z8042 Family history of malignant neoplasm of prostate: Secondary | ICD-10-CM | POA: Diagnosis not present

## 2017-11-09 DIAGNOSIS — Z803 Family history of malignant neoplasm of breast: Secondary | ICD-10-CM | POA: Diagnosis not present

## 2018-01-01 DIAGNOSIS — Z809 Family history of malignant neoplasm, unspecified: Secondary | ICD-10-CM | POA: Diagnosis not present

## 2018-02-05 DIAGNOSIS — R202 Paresthesia of skin: Secondary | ICD-10-CM | POA: Diagnosis not present

## 2018-02-05 DIAGNOSIS — M542 Cervicalgia: Secondary | ICD-10-CM | POA: Diagnosis not present

## 2018-02-05 DIAGNOSIS — M5410 Radiculopathy, site unspecified: Secondary | ICD-10-CM | POA: Diagnosis not present

## 2018-02-22 ENCOUNTER — Encounter: Payer: Self-pay | Admitting: Family Medicine

## 2018-02-22 ENCOUNTER — Ambulatory Visit: Payer: BLUE CROSS/BLUE SHIELD | Admitting: Family Medicine

## 2018-02-22 VITALS — BP 120/82 | HR 76 | Temp 97.9°F | Ht 68.5 in | Wt 152.2 lb

## 2018-02-22 DIAGNOSIS — L309 Dermatitis, unspecified: Secondary | ICD-10-CM | POA: Diagnosis not present

## 2018-02-22 DIAGNOSIS — Z0000 Encounter for general adult medical examination without abnormal findings: Secondary | ICD-10-CM

## 2018-02-22 DIAGNOSIS — R5383 Other fatigue: Secondary | ICD-10-CM | POA: Diagnosis not present

## 2018-02-22 LAB — CBC WITH DIFFERENTIAL/PLATELET
Basophils Absolute: 0 10*3/uL (ref 0.0–0.1)
Basophils Relative: 0.8 % (ref 0.0–3.0)
EOS ABS: 0.2 10*3/uL (ref 0.0–0.7)
EOS PCT: 4 % (ref 0.0–5.0)
HCT: 39.7 % (ref 36.0–46.0)
HEMOGLOBIN: 13.4 g/dL (ref 12.0–15.0)
LYMPHS ABS: 1.3 10*3/uL (ref 0.7–4.0)
Lymphocytes Relative: 29.9 % (ref 12.0–46.0)
MCHC: 33.7 g/dL (ref 30.0–36.0)
MCV: 95.4 fl (ref 78.0–100.0)
Monocytes Absolute: 0.4 10*3/uL (ref 0.1–1.0)
Monocytes Relative: 10.4 % (ref 3.0–12.0)
NEUTROS PCT: 54.9 % (ref 43.0–77.0)
Neutro Abs: 2.4 10*3/uL (ref 1.4–7.7)
Platelets: 276 10*3/uL (ref 150.0–400.0)
RBC: 4.16 Mil/uL (ref 3.87–5.11)
RDW: 12.6 % (ref 11.5–15.5)
WBC: 4.3 10*3/uL (ref 4.0–10.5)

## 2018-02-22 LAB — COMPREHENSIVE METABOLIC PANEL
ALBUMIN: 4.1 g/dL (ref 3.5–5.2)
ALT: 11 U/L (ref 0–35)
AST: 13 U/L (ref 0–37)
Alkaline Phosphatase: 44 U/L (ref 39–117)
BUN: 16 mg/dL (ref 6–23)
CHLORIDE: 103 meq/L (ref 96–112)
CO2: 29 mEq/L (ref 19–32)
CREATININE: 0.66 mg/dL (ref 0.40–1.20)
Calcium: 9.2 mg/dL (ref 8.4–10.5)
GFR: 95.23 mL/min (ref >60.00)
GLUCOSE: 88 mg/dL (ref 70–99)
POTASSIUM: 4.3 meq/L (ref 3.5–5.1)
Sodium: 137 mEq/L (ref 135–145)
Total Bilirubin: 0.6 mg/dL (ref 0.2–1.2)
Total Protein: 6.2 g/dL (ref 6.0–8.3)

## 2018-02-22 LAB — LIPID PANEL
CHOL/HDL RATIO: 3
Cholesterol: 225 mg/dL — ABNORMAL HIGH (ref 0–200)
HDL: 78.2 mg/dL (ref >39.00)
LDL Cholesterol: 128 mg/dL — ABNORMAL HIGH (ref 0–99)
NonHDL: 147.05
Triglycerides: 94 mg/dL (ref 0.0–149.0)
VLDL: 18.8 mg/dL (ref 0.0–40.0)

## 2018-02-22 MED ORDER — TRIAMCINOLONE ACETONIDE 0.1 % EX CREA: 1.0000 "application " | TOPICAL_CREAM | Freq: Two times a day (BID) | CUTANEOUS | 0 refills | Status: DC

## 2018-02-22 NOTE — Progress Notes (Signed)
Patient: [name] MRN: [id number] DOB: [birth year] PCP: Orma Flaming, MD     Subjective:  Chief Complaint  Patient presents with  . Establish Care    HPI: The patient is a 48 y.o. female who presents today for annual exam. She denies any changes to past medical history. There have been no recent hospitalizations. They are following are not following a well balanced diet and exercise plan. Weight has been stable. No complaints today.   She has some skin complaints. She has dry and itchy patches on her bilateral back that she thought was due to her bra. She states this has been there for about 6 months. It is always present. Does not spread. It is raised a little bit. She has put lotions, neosporin, over the counter medication on it. No new drugs, foods, soaps, detergents, exposure to plants.   She also has itchy rashes in her bilateral antecubital fossas that she has had for about one month.   FH of breast cancer in paternal aunt. Patient had BRCA done and it was negative. 26% likely to develop breast cancer. Mom has aneurysm on heart and dad has AAA. Dad has prostate cancer.   There is no immunization history on file for this patient.  Colonoscopy: routine at age 60  Mammogram: 07/2017. Done at 9 for women  Pap smear: 07/2017.normal  Tdap: she thinks done.  Flu: done at work.    Review of Systems  Constitutional: Positive for fatigue. Negative for chills and fever.  HENT: Negative for dental problem, ear pain, hearing loss and trouble swallowing.   Eyes: Negative for visual disturbance.  Respiratory: Negative for cough, chest tightness and shortness of breath.   Cardiovascular: Negative for chest pain, palpitations and leg swelling.  Gastrointestinal: Negative for abdominal pain, blood in stool, constipation, diarrhea and nausea.  Endocrine: Negative for cold intolerance, polydipsia, polyphagia and polyuria.  Genitourinary: Negative for dysuria and hematuria.   Musculoskeletal: Positive for neck stiffness. Negative for arthralgias, back pain and neck pain.  Skin: Negative for rash.       C/o itchiness on the insides of elbows and on both sides of back right under where bra strap goes  Neurological: Negative for dizziness and headaches.  Psychiatric/Behavioral: Positive for sleep disturbance. Negative for dysphoric mood. The patient is not nervous/anxious.     Allergies Patient is allergic to sulfa antibiotics.  Past Medical History Patient  has a past medical history of Arthritis and PONV (postoperative nausea and vomiting).  Surgical History Patient  has a past surgical history that includes Breast surgery; Dilation and curettage of uterus (02/2009); Endometrial biopsy (08/1998); Tonsillectomy; Mandible fracture surgery; and Breast lumpectomy with radioactive seed localization (Left, 05/13/2013).  Family History Pateint's family history includes Birth defects in her paternal grandfather; Cancer in her maternal aunt and paternal aunt.  Social History Patient  reports that she quit smoking about 14 years ago. She has never used smokeless tobacco. She reports current alcohol use. She reports that she does not use drugs.    Objective: Vitals:   02/22/18 0928  BP: 120/82  Pulse: 76  Temp: 97.9 F (36.6 C)  TempSrc: Oral  SpO2: 100%  Weight: 152 lb 3.2 oz (69 kg)  Height: 5' 8.5" (1.74 m)    Body mass index is 22.81 kg/m.  Physical Exam Vitals signs reviewed.  Constitutional:      Appearance: She is well-developed.  HENT:     Right Ear: External ear normal.     Left  Ear: External ear normal.  Eyes:     Conjunctiva/sclera: Conjunctivae normal.     Pupils: Pupils are equal, round, and reactive to light.  Neck:     Musculoskeletal: Normal range of motion and neck supple.     Thyroid: No thyromegaly.  Cardiovascular:     Rate and Rhythm: Normal rate and regular rhythm.     Heart sounds: Normal heart sounds. No murmur.  Pulmonary:      Effort: Pulmonary effort is normal.     Breath sounds: Normal breath sounds.  Abdominal:     General: Bowel sounds are normal. There is no distension.     Palpations: Abdomen is soft.     Tenderness: There is no abdominal tenderness.  Lymphadenopathy:     Cervical: No cervical adenopathy.  Skin:    General: Skin is warm and dry.     Findings: Rash (bilateral lateral sides of back near bra strap. Right side is thickened and somewhat plaque like. flesh colored. no rash on antecubital fossas. ) present.  Neurological:     Mental Status: She is alert and oriented to person, place, and time.     Cranial Nerves: No cranial nerve deficit.     Coordination: Coordination normal.     Deep Tendon Reflexes: Reflexes normal.  Psychiatric:        Behavior: Behavior normal.        Assessment/plan: 1. Annual physical exam Routine lab work. utd on [employer]. Requesting records for mmg/pap smear and tdap. She thinks she is up to date on this. F/u in one year or as needed.  - CBC with Differential/Platelet - Comprehensive metabolic panel - Lipid panel - TSH  2. Other fatigue  - VITAMIN D 25 Hydroxy (Vit-D Deficiency, Fractures)  3. Dermatitis ?eczema. Trial of triamcinolone and emollient. Discussed not to use on face and use prn or 7-10 days then off for a week. Let me know if not helping.     Return in about 1 year (around 02/23/2019).     Orma Flaming, MD [facility]  02/22/2018

## 2018-02-23 LAB — TSH: TSH: 0.82 u[IU]/mL (ref 0.35–4.50)

## 2018-02-23 LAB — VITAMIN D 25 HYDROXY (VIT D DEFICIENCY, FRACTURES): VITD: 24.14 ng/mL — AB (ref 30.00–100.00)

## 2018-07-23 ENCOUNTER — Other Ambulatory Visit: Payer: Self-pay | Admitting: Family Medicine

## 2018-10-27 ENCOUNTER — Other Ambulatory Visit: Payer: Self-pay

## 2018-10-27 ENCOUNTER — Ambulatory Visit: Payer: BLUE CROSS/BLUE SHIELD | Admitting: Family Medicine

## 2018-10-27 ENCOUNTER — Encounter: Payer: Self-pay | Admitting: Family Medicine

## 2018-10-27 VITALS — BP 122/70 | HR 65 | Temp 97.6°F | Ht 68.5 in | Wt 158.4 lb

## 2018-10-27 DIAGNOSIS — M899 Disorder of bone, unspecified: Secondary | ICD-10-CM

## 2018-10-27 DIAGNOSIS — R1013 Epigastric pain: Secondary | ICD-10-CM

## 2018-10-27 DIAGNOSIS — R200 Anesthesia of skin: Secondary | ICD-10-CM | POA: Diagnosis not present

## 2018-10-27 DIAGNOSIS — R202 Paresthesia of skin: Secondary | ICD-10-CM

## 2018-10-27 LAB — CBC WITH DIFFERENTIAL/PLATELET
Basophils Absolute: 0 10*3/uL (ref 0.0–0.1)
Basophils Relative: 0.8 % (ref 0.0–3.0)
Eosinophils Absolute: 0.3 10*3/uL (ref 0.0–0.7)
Eosinophils Relative: 6 % — ABNORMAL HIGH (ref 0.0–5.0)
HCT: 42.7 % (ref 36.0–46.0)
Hemoglobin: 14.4 g/dL (ref 12.0–15.0)
Lymphocytes Relative: 31.6 % (ref 12.0–46.0)
Lymphs Abs: 1.4 10*3/uL (ref 0.7–4.0)
MCHC: 33.7 g/dL (ref 30.0–36.0)
MCV: 96.3 fl (ref 78.0–100.0)
Monocytes Absolute: 0.4 10*3/uL (ref 0.1–1.0)
Monocytes Relative: 9.5 % (ref 3.0–12.0)
Neutro Abs: 2.3 10*3/uL (ref 1.4–7.7)
Neutrophils Relative %: 52.1 % (ref 43.0–77.0)
Platelets: 346 10*3/uL (ref 150.0–400.0)
RBC: 4.44 Mil/uL (ref 3.87–5.11)
RDW: 12.2 % (ref 11.5–15.5)
WBC: 4.5 10*3/uL (ref 4.0–10.5)

## 2018-10-27 LAB — COMPREHENSIVE METABOLIC PANEL
ALT: 11 U/L (ref 0–35)
AST: 16 U/L (ref 0–37)
Albumin: 4.6 g/dL (ref 3.5–5.2)
Alkaline Phosphatase: 55 U/L (ref 39–117)
BUN: 14 mg/dL (ref 6–23)
CO2: 28 mEq/L (ref 19–32)
Calcium: 10.1 mg/dL (ref 8.4–10.5)
Chloride: 104 mEq/L (ref 96–112)
Creatinine, Ser: 0.62 mg/dL (ref 0.40–1.20)
GFR: 102.07 mL/min (ref >60.00)
Glucose, Bld: 91 mg/dL (ref 70–99)
Potassium: 3.8 mEq/L (ref 3.5–5.1)
Sodium: 140 mEq/L (ref 135–145)
Total Bilirubin: 0.6 mg/dL (ref 0.2–1.2)
Total Protein: 7.1 g/dL (ref 6.0–8.3)

## 2018-10-27 LAB — VITAMIN B12: Vitamin B-12: 362 pg/mL (ref 211–911)

## 2018-10-27 LAB — LIPASE: Lipase: 21 U/L (ref 11.0–59.0)

## 2018-10-27 MED ORDER — DICLOFENAC SODIUM 1 % TD GEL: 4.0000 g | Freq: Four times a day (QID) | TRANSDERMAL | 1 refills | Status: DC

## 2018-10-27 MED ORDER — BACLOFEN 20 MG PO TABS: 20.0000 mg | ORAL_TABLET | Freq: Three times a day (TID) | ORAL | 1 refills | Status: DC

## 2018-10-27 MED ORDER — FAMOTIDINE 40 MG PO TABS: 20.0000 mg | ORAL_TABLET | Freq: Every day | ORAL | 0 refills | Status: DC

## 2018-10-27 NOTE — Progress Notes (Signed)
Patient: [name] MRN: [id number] DOB: 17-Nov-1970 PCP: Orma Flaming, MD     Subjective:  Chief Complaint  Patient presents with  . Back Pain    X 9 months  . Gastroesophageal Reflux    HPI: The patient is a 48 y.o. female who presents today for back pain and possible reflux.   GERD: she feels like indigestion feeling started a few months ago when she started back to school. She is Pharmacist, hospital. The last few weeks, symptoms have really gotten "annoying." she notices this the most after she eats and lying down. Burning substernally and she wishes she could burp. She has taken tums and this helps a little bit. She eats a lot of salads. She has been taking aleve more recently. She doesn't eat greasy/fried foods regularly. Limited caffiene. She does drink alcohol, but not daily. She is also more stressed at work/covid/etc. She has not traveled overseas, no well water and no pain in stomach. No N/V/D. No blood in stool and on dark stools.   Back pain: has been going on x9 months intermittently. She went to urgent care and they gave her an EKG. Muscle relaxers and steroids helped. She has pain more in her left shoulder/trap area and has symptoms down her left arm. She gets tingles in her left arm and at times weakness in her left arm. She is right handed. She will get tightness across her upper back as well. She is unsure about numbness. She has not tried heat, biofreeze and ice help a little. She feels like her arm stuff is related to her back pain. Pain in scapular area and trapezius. No trauma to her neck. She has been sitting more with poor posture. She now has a stand up desk she used for the first time yesterday.   Review of Systems  Constitutional: Negative for fatigue.  Respiratory: Negative for shortness of breath.   Cardiovascular: Negative for chest pain, palpitations and leg swelling.  Gastrointestinal: Negative for abdominal pain, blood in stool, constipation, diarrhea, nausea and  vomiting.  Musculoskeletal: Positive for back pain, neck pain and neck stiffness.       C/o lower back pain x 9 mos  Neurological: Negative for numbness.       C/o tingling down L arm into hand  Psychiatric/Behavioral: Positive for sleep disturbance.    Allergies Patient is allergic to sulfa antibiotics.  Past Medical History Patient  has a past medical history of Arthritis, Heart murmur, History of chicken pox, History of recurrent UTIs, PONV (postoperative nausea and vomiting), and Screening for HIV (human immunodeficiency virus).  Surgical History Patient  has a past surgical history that includes Breast surgery; Dilation and curettage of uterus (02/2009); Endometrial biopsy (08/1998); Tonsillectomy; Mandible fracture surgery; and Breast lumpectomy with radioactive seed localization (Left, 05/13/2013).  Family History Pateint's family history includes Arthritis in her father and mother; Birth defects in her paternal grandfather; COPD in her father; Cancer in her father, maternal aunt, maternal grandfather, and paternal aunt; Depression in her father and mother; Heart attack in her maternal grandfather; Heart disease in her maternal grandfather; Hyperlipidemia in her maternal grandfather; Hypertension in her father; Miscarriages / [place] in her sister and sister.  Social History Patient  reports that she quit smoking about 15 years ago. She has never used smokeless tobacco. She reports current alcohol use. She reports that she does not use drugs.    Objective: Vitals:   10/27/18 0803  BP: 122/70  Pulse: 65  Temp: 97.6  F (36.4 C)  TempSrc: Skin  SpO2: 98%  Weight: 158 lb 6.4 oz (71.8 kg)  Height: 5' 8.5" (1.74 m)    Body mass index is 23.73 kg/m.  Physical Exam Vitals signs reviewed.  Constitutional:      Appearance: Normal appearance. She is normal weight.  HENT:     Head: Normocephalic and atraumatic.  Eyes:     Extraocular Movements: Extraocular movements intact.      Pupils: Pupils are equal, round, and reactive to light.  Neck:     Musculoskeletal: Normal range of motion and neck supple. No neck rigidity or muscular tenderness.  Abdominal:     General: Abdomen is flat. Bowel sounds are normal.     Palpations: Abdomen is soft.     Tenderness: There is abdominal tenderness (epigastric area ). There is no guarding or rebound.     Comments: Negative murphys sign   Musculoskeletal:     Comments: strenght 5/5 in upper left extremity 5/5 in hand grip. Shoulder exam normal. She does have TTP over her left trapezius and it's very tight. She is also ttp in her left scapula/rhomboid area. No neck pain/decreased rom or abnormality   Neurological:     General: No focal deficit present.     Mental Status: She is alert and oriented to person, place, and time.     Cranial Nerves: No cranial nerve deficit.     Sensory: No sensory deficit.     Deep Tendon Reflexes: Reflexes normal.     Comments: +tinel and phalen's in left hand.   Psychiatric:        Mood and Affect: Mood normal.        Behavior: Behavior normal.        Assessment/plan: 1. Epigastric pain Likely GERD vs. Gastritis vs. PUD. Could be secondary to stress/NSAID use. Labs to r/o h.pylori/pancreas/gallbladder. starting her on pepcid and if this isn't enough, will increase to PPI. Discussed diet, decreasing alcohol, caffeine and stopping NSAIDs. Precautions given.  - Comprehensive metabolic panel - CBC with Differential/Platelet - Lipase - H. pylori breath test  2. Numbness and tingling in left hand Likely carpal tunnel based off exam. Starting voltaren gel qid, thumb spica. If not better we will do emgs.  - DG Cervical Spine Complete; Future - Vitamin B12  3. Scapular dysfunction Pt referral, muscle relaxers, heat. Really needs to work on posture and strengthening core. Advised she could try a back brace and did just get a stand up desk. Massages prn. If not better, may need to mri neck if  possibly associated with hand issue per above.  - DG Scapula Left; Future - Ambulatory referral to Physical Therapy  All information written down. Xray down today so she will return for this.    Return if symptoms worsen or fail to improve.   Orland Mustard, MD  [facility]   10/27/2018

## 2018-10-27 NOTE — Patient Instructions (Signed)
Tingling in left hand: I think you have carpal tunnel. I want you to get a left thumb spica wrist splint. You will wear this at night and if you can during day. I also sent in voltaren gel for you to use up to four times a day. It's an anti inflammatory gel and will safe your stomach from NSAIDs. If not better, please let me know so we can do some more work up or send for possible surgery for this and nerve studies.    Left trapezius/scapular issue: referral to physical therapy here. Posture and core work is huge. Could try a back brace as well. Heating pad/massage and muscle relaxer. I sent in a muscle relaxer called baclofen. You can take up to three times a day. I would try half a pill at first for drowsy issues. Shouldn't knock you out though.    GERD: decrease caffine/alcohol. Likely stress related. Will do trial of pepcid, if this doesn't work we need to increase strength and I will need to know so I can send in different medication.    Gastroesophageal Reflux Disease, Adult Gastroesophageal reflux (GER) happens when acid from the stomach flows up into the tube that connects the mouth and the stomach (esophagus). Normally, food travels down the esophagus and stays in the stomach to be digested. With GER, food and stomach acid sometimes move back up into the esophagus. You may have a disease called gastroesophageal reflux disease (GERD) if the reflux:  Happens often.  Causes frequent or very bad symptoms.  Causes problems such as damage to the esophagus. When this happens, the esophagus becomes sore and swollen (inflamed). Over time, GERD can make small holes (ulcers) in the lining of the esophagus. What are the causes? This condition is caused by a problem with the muscle between the esophagus and the stomach. When this muscle is weak or not normal, it does not close properly to keep food and acid from coming back up from the stomach. The muscle can be weak because of:  Tobacco use.   Pregnancy.  Having a certain type of hernia (hiatal hernia).  Alcohol use.  Certain foods and drinks, such as coffee, chocolate, onions, and peppermint. What increases the risk? You are more likely to develop this condition if you:  Are overweight.  Have a disease that affects your connective tissue.  Use NSAID medicines. What are the signs or symptoms? Symptoms of this condition include:  Heartburn.  Difficult or painful swallowing.  The feeling of having a lump in the throat.  A bitter taste in the mouth.  Bad breath.  Having a lot of saliva.  Having an upset or bloated stomach.  Belching.  Chest pain. Different conditions can cause chest pain. Make sure you see your doctor if you have chest pain.  Shortness of breath or noisy breathing (wheezing).  Ongoing (chronic) cough or a cough at night.  Wearing away of the surface of teeth (tooth enamel).  Weight loss. How is this treated? Treatment will depend on how bad your symptoms are. Your doctor may suggest:  Changes to your diet.  Medicine.  Surgery. Follow these instructions at home: Eating and drinking   Follow a diet as told by your doctor. You may need to avoid foods and drinks such as: ? Coffee and tea (with or without caffeine). ? Drinks that contain alcohol. ? Energy drinks and sports drinks. ? Bubbly (carbonated) drinks or sodas. ? Chocolate and cocoa. ? Peppermint and mint flavorings. ?  Garlic and onions. ? Horseradish. ? Spicy and acidic foods. These include peppers, chili powder, curry powder, vinegar, hot sauces, and BBQ sauce. ? Citrus fruit juices and citrus fruits, such as oranges, lemons, and limes. ? Tomato-based foods. These include red sauce, chili, salsa, and pizza with red sauce. ? Fried and fatty foods. These include donuts, french fries, potato chips, and high-fat dressings. ? High-fat meats. These include hot dogs, rib eye steak, sausage, ham, and bacon. ? High-fat dairy  items, such as whole milk, butter, and cream cheese.  Eat small meals often. Avoid eating large meals.  Avoid drinking large amounts of liquid with your meals.  Avoid eating meals during the 2-3 hours before bedtime.  Avoid lying down right after you eat.  Do not exercise right after you eat. Lifestyle   Do not use any products that contain nicotine or tobacco. These include cigarettes, e-cigarettes, and chewing tobacco. If you need help quitting, ask your doctor.  Try to lower your stress. If you need help doing this, ask your doctor.  If you are overweight, lose an amount of weight that is healthy for you. Ask your doctor about a safe weight loss goal. General instructions  Pay attention to any changes in your symptoms.  Take over-the-counter and prescription medicines only as told by your doctor. Do not take aspirin, ibuprofen, or other NSAIDs unless your doctor says it is okay.  Wear loose clothes. Do not wear anything tight around your waist.  Raise (elevate) the head of your bed about 6 inches (15 cm).  Avoid bending over if this makes your symptoms worse.  Keep all follow-up visits as told by your doctor. This is important. Contact a doctor if:  You have new symptoms.  You lose weight and you do not know why.  You have trouble swallowing or it hurts to swallow.  You have wheezing or a cough that keeps happening.  Your symptoms do not get better with treatment.  You have a hoarse voice. Get help right away if:  You have pain in your arms, neck, jaw, teeth, or back.  You feel sweaty, dizzy, or light-headed.  You have chest pain or shortness of breath.  You throw up (vomit) and your throw-up looks like blood or coffee grounds.  You pass out (faint).  Your poop (stool) is bloody or black.  You cannot swallow, drink, or eat. Summary  If a person has gastroesophageal reflux disease (GERD), food and stomach acid move back up into the esophagus and cause  symptoms or problems such as damage to the esophagus.  Treatment will depend on how bad your symptoms are.  Follow a diet as told by your doctor.  Take all medicines only as told by your doctor. This information is not intended to replace advice given to you by your health care provider. Make sure you discuss any questions you have with your health care provider. Document Released: 06/11/2007 Document Revised: 07/01/2017 Document Reviewed: Carpal Tunnel Syndrome  Carpal tunnel syndrome is a condition that causes pain in your hand and arm. The carpal tunnel is a narrow area that is on the palm side of your wrist. Repeated wrist motion or certain diseases may cause swelling in the tunnel. This swelling can pinch the main nerve in the wrist (median nerve). What are the causes? This condition may be caused by:  Repeated wrist motions.  Wrist injuries.  Arthritis.  A sac of fluid (cyst) or abnormal growth (tumor) in the carpal tunnel.  Fluid buildup during pregnancy. Sometimes the cause is not known. What increases the risk? The following factors may make you more likely to develop this condition:  Having a job in which you move your wrist in the same way many times. This includes jobs like being a Midwifebutcher or a Conservation officer, naturecashier.  Being a woman.  Having other health conditions, such as: ? Diabetes. ? Obesity. ? A thyroid gland that is not active enough (hypothyroidism). ? Kidney failure. What are the signs or symptoms? Symptoms of this condition include:  A tingling feeling in your fingers.  Tingling or a loss of feeling (numbness) in your hand.  Pain in your entire arm. This pain may get worse when you bend your wrist and elbow for a long time.  Pain in your wrist that goes up your arm to your shoulder.  Pain that goes down into your palm or fingers.  A weak feeling in your hands. You may find it hard to grab and hold items. You may feel worse at night. How is this diagnosed?  This condition is diagnosed with a medical history and physical exam. You may also have tests, such as:  Electromyogram (EMG). This test checks the signals that the nerves send to the muscles.  Nerve conduction study. This test checks how well signals pass through your nerves.  Imaging tests, such as X-rays, ultrasound, and MRI. These tests check for what might be the cause of your condition. How is this treated? This condition may be treated with:  Lifestyle changes. You will be asked to stop or change the activity that caused your problem.  Doing exercise and activities that make bones and muscles stronger (physical therapy).  Learning how to use your hand again (occupational therapy).  Medicines for pain and swelling (inflammation). You may have injections in your wrist.  A wrist splint.  Surgery. Follow these instructions at home: If you have a splint:  Wear the splint as told by your doctor. Remove it only as told by your doctor.  Loosen the splint if your fingers: ? Tingle. ? Lose feeling (become numb). ? Turn cold and blue.  Keep the splint clean.  If the splint is not waterproof: ? Do not let it get wet. ? Cover it with a watertight covering when you take a bath or a shower. Managing pain, stiffness, and swelling   If told, put ice on the painful area: ? If you have a removable splint, remove it as told by your doctor. ? Put ice in a plastic bag. ? Place a towel between your skin and the bag. ? Leave the ice on for 20 minutes, 2-3 times per day. General instructions  Take over-the-counter and prescription medicines only as told by your doctor.  Rest your wrist from any activity that may cause pain. If needed, talk with your boss at work about changes that can help your wrist heal.  Do any exercises as told by your doctor, physical therapist, or occupational therapist.  Keep all follow-up visits as told by your doctor. This is important. Contact a doctor  if:  You have new symptoms.  Medicine does not help your pain.  Your symptoms get worse. Get help right away if:  You have very bad numbness or tingling in your wrist or hand. Summary  Carpal tunnel syndrome is a condition that causes pain in your hand and arm.  It is often caused by repeated wrist motions.  Lifestyle changes and medicines are used to treat  this problem. Surgery may help in very bad cases.  Follow your doctor's instructions about wearing a splint, resting your wrist, keeping follow-up visits, and calling for help. This information is not intended to replace advice given to you by your health care provider. Make sure you discuss any questions you have with your health care provider. Document Released: 12/12/2010 Document Revised: 05/01/2017 Document Reviewed: 05/01/2017 Elsevier Patient Education  2020 [employer].  07/01/2017 Elsevier Patient Education  2020 [employer].

## 2018-10-28 LAB — H. PYLORI BREATH TEST: H. pylori Breath Test: NOT DETECTED

## 2018-11-03 ENCOUNTER — Other Ambulatory Visit: Payer: BC Managed Care – PPO

## 2018-11-04 ENCOUNTER — Other Ambulatory Visit: Payer: BC Managed Care – PPO

## 2018-11-04 ENCOUNTER — Other Ambulatory Visit: Payer: Self-pay

## 2018-11-04 ENCOUNTER — Ambulatory Visit (INDEPENDENT_AMBULATORY_CARE_PROVIDER_SITE_OTHER): Payer: BC Managed Care – PPO

## 2018-11-04 ENCOUNTER — Ambulatory Visit (INDEPENDENT_AMBULATORY_CARE_PROVIDER_SITE_OTHER): Payer: BC Managed Care – PPO | Admitting: Physical Therapy

## 2018-11-04 ENCOUNTER — Encounter: Payer: Self-pay | Admitting: Physical Therapy

## 2018-11-04 DIAGNOSIS — M47812 Spondylosis without myelopathy or radiculopathy, cervical region: Secondary | ICD-10-CM | POA: Diagnosis not present

## 2018-11-04 DIAGNOSIS — M25512 Pain in left shoulder: Secondary | ICD-10-CM

## 2018-11-04 DIAGNOSIS — R202 Paresthesia of skin: Secondary | ICD-10-CM

## 2018-11-04 DIAGNOSIS — M62838 Other muscle spasm: Secondary | ICD-10-CM

## 2018-11-04 DIAGNOSIS — M5412 Radiculopathy, cervical region: Secondary | ICD-10-CM | POA: Diagnosis not present

## 2018-11-04 DIAGNOSIS — M899 Disorder of bone, unspecified: Secondary | ICD-10-CM | POA: Diagnosis not present

## 2018-11-04 DIAGNOSIS — R200 Anesthesia of skin: Secondary | ICD-10-CM

## 2018-11-08 ENCOUNTER — Encounter: Payer: Self-pay | Admitting: Physical Therapy

## 2018-11-08 DIAGNOSIS — S0501XA Injury of conjunctiva and corneal abrasion without foreign body, right eye, initial encounter: Secondary | ICD-10-CM | POA: Diagnosis not present

## 2018-11-08 NOTE — Patient Instructions (Signed)
Access Code: [email or web address]  URL: [email or web address]  Date: 11/04/2018  Prepared by: Lyndee Hensen   Exercises Seated Cervical Sidebending Stretch - 3 reps - 30 hold - 3x daily Wrist Extension Stretch Pronated - 3 reps - 30 hold - 2x daily Wrist Prayer Stretch - 3 reps - 30 hold - 2x daily Seated Wrist Extension with Overpressure - 3 reps - 30 hold - 2x daily

## 2018-11-08 NOTE — Therapy (Signed)
[facility] [address] [address] [place], [place], [address] Phone: [phone]   Fax:  [phone]  Physical Therapy Evaluation  Patient Details  Name: [name] MRN: [id number] Date of Birth: 10/24/1970 Referring Provider (PT): Mickey FarberAlison Wolfe   Encounter Date: 11/04/2018  PT End of Session - 11/08/18 1248    Visit Number  1    Number of Visits  12    Date for PT Re-Evaluation  12/16/18    Authorization Type  BCBS    PT Start Time  1515    PT Stop Time  1555    PT Time Calculation (min)  40 min    Activity Tolerance  Patient tolerated treatment well    Behavior During Therapy  [facility] for tasks assessed/performed       Past Medical History:  Diagnosis Date  . Arthritis    osteo arthritist  . Heart murmur   . History of chicken pox   . History of recurrent UTIs   . PONV (postoperative nausea and vomiting)   . Screening for HIV (human immunodeficiency virus)     Past Surgical History:  Procedure Laterality Date  . BREAST LUMPECTOMY WITH RADIOACTIVE SEED LOCALIZATION Left 05/13/2013   Procedure: BREAST LUMPECTOMY WITH RADIOACTIVE SEED LOCALIZATION;  Surgeon: Ernestene MentionHaywood M Ingram, MD;  Location: [facility];  Service: General;  Laterality: Left;  . BREAST SURGERY     breast augmentation 08/1998  . DILATION AND CURETTAGE OF UTERUS  02/2009  . ENDOMETRIAL BIOPSY  08/1998  . MANDIBLE FRACTURE SURGERY     1988  . TONSILLECTOMY     1970    There were no vitals filed for this visit.   Subjective Assessment - 11/08/18 1247    Subjective  Pt states increased pain in L UE, shoulder, UT, and into hand.  She has had Back pain previously not since 5 yrs ago, but recently was getting up from floor and hurt her back. Arm, L shoulder pain started at this same time that she twisted back. She has not had neck pain previously, and states minmal neck pain at this time. Is also wearing Wrist immobilizer on L for hand pain.    Limitations   Reading;Lifting;Writing;House hold activities    Patient Stated Goals  Decreased pain    Currently in Pain?  Yes    Pain Score  4     Pain Location  Shoulder    Pain Orientation  Left    Pain Descriptors / Indicators  Dull    Pain Type  Acute pain    Pain Onset  1 to 4 weeks ago    Pain Frequency  Intermittent    Aggravating Factors   driving, increased activity, also no reason, hurts at rest    Multiple Pain Sites  Yes    Pain Score  6    Pain Location  Arm    Pain Orientation  Left    Pain Descriptors / Indicators  Burning;Pins and needles    Pain Type  Acute pain    Pain Onset  1 to 4 weeks ago    Pain Frequency  Intermittent         [facility] PT Assessment - 11/08/18 0001      Assessment   Medical Diagnosis  Scapular dysfunction    Referring Provider (PT)  Mickey FarberAlison Wolfe    Hand Dominance  Right    Prior Therapy  No      Balance Screen  Has the patient fallen in the past 6 months  No      Prior Function   Level of Independence  Independent      Cognition   Overall Cognitive Status  Within Functional Limits for tasks assessed      ROM / Strength   AROM / PROM / Strength  AROM;Strength      AROM   Overall AROM Comments  L shoulder, mild limitation due to pain;  Elbow: WNL, Hand: WNL;  Cervical: ROM WNL, pain with full L/R rotation in UT region.       Strength   Overall Strength Comments  Shoulder: 4/5,     Strength Assessment Site  Hand    Right/Left hand  Right;Left    Right Hand Grip (lbs)  60    Left Hand Grip (lbs)  50      Palpation   Palpation comment  Painful palpation to L posterior shoulder, teres, infraspinatus, significant tightness in Bil UTs.        Special Tests   Other special tests  Mild increase in pain with Tinels;  Neg cervical compresison/distraction; Neg shoulder tests;  + increase in UE symptoms wtih palpation of trigger points in posterior shoulder.                 Objective measurements completed on examination: See above  findings.      [facility] Adult PT Treatment/Exercise - 11/08/18 0001      Exercises   Exercises  Neck      Manual Therapy   Manual therapy comments  skilled palpation and monitoring of soft tissue with dry needling.       Neck Exercises: Stretches   Upper Trapezius Stretch  2 reps;30 seconds;Right;Left    Other Neck Stretches  L wrist flex and ext stretches 30 sec x2 each;        Trigger Point Dry Needling - 11/08/18 0001    Consent Given?  Yes    Education Handout Provided  Yes    Muscles Treated Head and Neck  Upper trapezius   bilateral   Upper Trapezius Response  Twitch reponse elicited;Palpable increased muscle length           PT Education - 11/08/18 1248    Education Details  HEP, PT POC, Exam findings, Dry needling risks, precautions, benefits, use    Person(s) Educated  Patient    Methods  Explanation;Demonstration;Tactile cues;Handout;Verbal cues    Comprehension  Verbalized understanding;Returned demonstration;Verbal cues required;Tactile cues required;Need further instruction       PT Short Term Goals - 11/08/18 2118      PT SHORT TERM GOAL #1   Title  Pt to be independent with initial HEP    Time  2    Period  Weeks    Status  New    Target Date  11/18/18      PT SHORT TERM GOAL #2   Title  Pt to report decreased radicular pain in L UE to 4/10    Time  2    Period  Weeks    Status  New    Target Date  11/18/18        PT Long Term Goals - 11/08/18 2119      PT LONG TERM GOAL #1   Title  Pt to be independent with final HEP    Time  6    Period  Weeks    Status  New    Target Date  12/16/18      PT LONG TERM GOAL #2   Title  Pt to report decreased pain in L shoulder and UE to 0-2/10 with activity    Time  6    Period  Weeks    Status  New    Target Date  12/16/18      PT LONG TERM GOAL #3   Title  Pt to demo increased strength of L shoulder and UE to at least 4+/5 to improve ability for lifting, carrying, and IADLs.    Time  6     Period  Weeks    Status  New    Target Date  12/16/18      PT LONG TERM GOAL #4   Title  Pt to have soft tissue restrictions in neck and shoulder to be WNL, for improved ROM and pain    Time  6    Period  Weeks    Status  New    Target Date  12/16/18             Plan - 11/08/18 1251    Clinical Impression Statement  Pt presents with primary complaint of increased pain in L UE. She has soreness and significant tightness in Bil UT region, as well as into posterior shoulder and scapula on L. Pt with mild ROM deficits in neck, and deficits with full shoulder AROM due to pain. Pt with decreased strength in L shoulder and UE.She has likely radiculopathy from cervical region that is contributing to pain in UE. She also has tightness in surrounding musculature that is painful. Pt may have underlying carpal tunnel as well on L hand, will continue to assess for source of pain, but likely radicular. Pt will continue to wear brace at this time if she thinks it is helping. Pt to benefit from skilled PT to improve deficits and pain.    Examination-Activity Limitations  Transfers;Reach Overhead;Sit;Carry;Dressing;Hygiene/Grooming;Stand;Lift    Examination-Participation Restrictions  Meal Prep;Cleaning;Community Activity;Driving;Shop;Laundry;Yard Work    Merchant navy officer  Evolving/Moderate complexity    Clinical Decision Making  Moderate    Rehab Potential  Good    PT Frequency  2x / week    PT Duration  6 weeks    PT Treatment/Interventions  ADLs/Self Care Home Management;Cryotherapy;Electrical Stimulation;DME Instruction;Ultrasound;Traction;Moist Heat;Iontophoresis 4mg /ml Dexamethasone;Gait training;Stair training;Functional mobility training;Therapeutic activities;Therapeutic exercise;Neuromuscular re-education;Manual techniques;Patient/family education;Passive range of motion;Dry needling;Joint Manipulations;Spinal Manipulations;Taping    Consulted and Agree with Plan of Care   Patient       Patient will benefit from skilled therapeutic intervention in order to improve the following deficits and impairments:  Decreased range of motion, Impaired UE functional use, Increased muscle spasms, Decreased activity tolerance, Pain, Impaired flexibility, Improper body mechanics, Decreased mobility, Decreased strength  Visit Diagnosis: Radiculopathy, cervical region  Other muscle spasm  Acute pain of left shoulder     Problem List Patient Active Problem List   Diagnosis Date Noted  . Abnormal mammogram 05/02/2013   Lyndee Hensen, PT, DPT 9:27 PM  11/08/18    [place], [place], [address] Phone: [phone]   Fax:  [phone]  Name: [name] MRN: [id number] Date of Birth: [birth year]

## 2018-11-11 ENCOUNTER — Other Ambulatory Visit: Payer: Self-pay

## 2018-11-11 ENCOUNTER — Ambulatory Visit: Payer: BC Managed Care – PPO | Admitting: Physical Therapy

## 2018-11-11 DIAGNOSIS — M62838 Other muscle spasm: Secondary | ICD-10-CM

## 2018-11-11 DIAGNOSIS — M5412 Radiculopathy, cervical region: Secondary | ICD-10-CM

## 2018-11-11 DIAGNOSIS — M25512 Pain in left shoulder: Secondary | ICD-10-CM

## 2018-11-14 ENCOUNTER — Encounter: Payer: Self-pay | Admitting: Physical Therapy

## 2018-11-14 NOTE — Therapy (Signed)
[facility] [address] [address] [place], [place], [address] Phone: [phone]   Fax:  [phone]  Physical Therapy Treatment  Patient Details  Name: Rhonda CohoJanell Glockner MRN: [id number] Date of Birth: 05/25/1970 Referring Provider (PT): Mickey FarberAlison Wolfe   Encounter Date: 11/11/2018  PT End of Session - 11/14/18 2113    Visit Number  2    Number of Visits  12    Date for PT Re-Evaluation  12/16/18    Authorization Type  BCBS    PT Start Time  1516    PT Stop Time  1557    PT Time Calculation (min)  41 min    Activity Tolerance  Patient tolerated treatment well    Behavior During Therapy  [facility] for tasks assessed/performed       Past Medical History:  Diagnosis Date  . Arthritis    osteo arthritist  . Heart murmur   . History of chicken pox   . History of recurrent UTIs   . PONV (postoperative nausea and vomiting)   . Screening for HIV (human immunodeficiency virus)     Past Surgical History:  Procedure Laterality Date  . BREAST LUMPECTOMY WITH RADIOACTIVE SEED LOCALIZATION Left 05/13/2013   Procedure: BREAST LUMPECTOMY WITH RADIOACTIVE SEED LOCALIZATION;  Surgeon: Ernestene MentionHaywood M Ingram, MD;  Location: [facility];  Service: General;  Laterality: Left;  . BREAST SURGERY     breast augmentation 08/1998  . DILATION AND CURETTAGE OF UTERUS  02/2009  . ENDOMETRIAL BIOPSY  08/1998  . MANDIBLE FRACTURE SURGERY     1988  . TONSILLECTOMY     1970    There were no vitals filed for this visit.  Subjective Assessment - 11/14/18 2111    Subjective  Pt is still sore, but states Dry neeling helped tightness and soreness in trap. Tingling into L hand has been mildly reduced.    Patient Stated Goals  Decreased pain    Currently in Pain?  Yes    Pain Score  4     Pain Location  Shoulder    Pain Orientation  Left    Pain Descriptors / Indicators  Dull;Tightness    Pain Type  Acute pain    Pain Onset  1 to 4 weeks ago    Pain Frequency   Intermittent    Pain Score  4    Pain Location  Arm    Pain Orientation  Left    Pain Descriptors / Indicators  Burning;Pins and needles    Pain Type  Acute pain    Pain Onset  1 to 4 weeks ago    Pain Frequency  Intermittent                       [facility] Adult PT Treatment/Exercise - 11/14/18 0001      Neck Exercises: Supine   Neck Retraction  10 reps      Modalities   Modalities  Moist Heat      Manual Therapy   Manual Therapy  Joint mobilization;Manual Traction;Passive ROM;Soft tissue mobilization    Manual therapy comments  skilled palpation and monitoring of soft tissue with dry needling.     Joint Mobilization  PA mobs for c-spine    Soft tissue mobilization  to L posterior shoulder, teres, infra, and L UT, levator    Manual Traction  10 sec x10 for c-spine       Neck Exercises: Stretches   Upper Trapezius  Stretch  2 reps;30 seconds;Right;Left    Levator Stretch  2 reps;30 seconds;Left    Chest Stretch  60 seconds   Supine Pec stretch  with nerve glides x20      Trigger Point Dry Needling - 11/14/18 0001    Consent Given?  Yes    Education Handout Provided  Previously provided    Muscles Treated Head and Neck  Upper trapezius;Levator scapulae    Muscles Treated Upper Quadrant  Infraspinatus;Teres major;Teres minor    Upper Trapezius Response  Twitch reponse elicited;Palpable increased muscle length    Levator Scapulae Response  Twitch response elicited;Palpable increased muscle length    Infraspinatus Response  Twitch response elicited;Palpable increased muscle length    Teres major Response  Twitch response elicited;Palpable increased muscle length    Teres minor Response  Twitch response elicited;Palpable increased muscle length             PT Short Term Goals - 11/08/18 2118      PT SHORT TERM GOAL #1   Title  Pt to be independent with initial HEP    Time  2    Period  Weeks    Status  New    Target Date  11/18/18      PT SHORT TERM  GOAL #2   Title  Pt to report decreased radicular pain in L UE to 4/10    Time  2    Period  Weeks    Status  New    Target Date  11/18/18        PT Long Term Goals - 11/08/18 2119      PT LONG TERM GOAL #1   Title  Pt to be independent with final HEP    Time  6    Period  Weeks    Status  New    Target Date  12/16/18      PT LONG TERM GOAL #2   Title  Pt to report decreased pain in L shoulder and UE to 0-2/10 with activity    Time  6    Period  Weeks    Status  New    Target Date  12/16/18      PT LONG TERM GOAL #3   Title  Pt to demo increased strength of L shoulder and UE to at least 4+/5 to improve ability for lifting, carrying, and IADLs.    Time  6    Period  Weeks    Status  New    Target Date  12/16/18      PT LONG TERM GOAL #4   Title  Pt to have soft tissue restrictions in neck and shoulder to be WNL, for improved ROM and pain    Time  6    Period  Weeks    Status  New    Target Date  12/16/18            Plan - 11/14/18 2114    Clinical Impression Statement  Pt with significant muscle tightness and sorenss in posterior shoulder and neck. Addressed with manual therapy and dry needling, with good outcome today. Reviewed stretches for neck. Will issue more formal HEP next visit as pain allows. Plan to focus on decreasing muscle tension and radicular pain. WIll progress as tolerated.    Examination-Activity Limitations  Transfers;Reach Overhead;Sit;Carry;Dressing;Hygiene/Grooming;Stand;Lift    Examination-Participation Restrictions  Meal Prep;Cleaning;Community Activity;Driving;Shop;Laundry;Yard Work    Geographical information systems officer  Good    PT Frequency  2x / week    PT Duration  6 weeks    PT Treatment/Interventions  ADLs/Self Care Home Management;Cryotherapy;Electrical Stimulation;DME Instruction;Ultrasound;Traction;Moist Heat;Iontophoresis 4mg /ml Dexamethasone;Gait training;Stair training;Functional  mobility training;Therapeutic activities;Therapeutic exercise;Neuromuscular re-education;Manual techniques;Patient/family education;Passive range of motion;Dry needling;Joint Manipulations;Spinal Manipulations;Taping    Consulted and Agree with Plan of Care  Patient       Patient will benefit from skilled therapeutic intervention in order to improve the following deficits and impairments:  Decreased range of motion, Impaired UE functional use, Increased muscle spasms, Decreased activity tolerance, Pain, Impaired flexibility, Improper body mechanics, Decreased mobility, Decreased strength  Visit Diagnosis: Radiculopathy, cervical region  Other muscle spasm  Acute pain of left shoulder     Problem List Patient Active Problem List   Diagnosis Date Noted  . Abnormal mammogram 05/02/2013   Lyndee Hensen, PT, DPT 9:16 PM  11/14/18    [place], [place], [address] Phone: [phone]   Fax:  [phone]  Name: Deedee Lybarger MRN: [id number] Date of Birth: [birth year]

## 2018-11-16 ENCOUNTER — Ambulatory Visit: Payer: BC Managed Care – PPO | Admitting: Physical Therapy

## 2018-11-16 ENCOUNTER — Other Ambulatory Visit: Payer: Self-pay

## 2018-11-16 ENCOUNTER — Encounter: Payer: Self-pay | Admitting: Physical Therapy

## 2018-11-16 DIAGNOSIS — M25512 Pain in left shoulder: Secondary | ICD-10-CM | POA: Diagnosis not present

## 2018-11-16 DIAGNOSIS — M5412 Radiculopathy, cervical region: Secondary | ICD-10-CM

## 2018-11-16 DIAGNOSIS — M62838 Other muscle spasm: Secondary | ICD-10-CM | POA: Diagnosis not present

## 2018-11-16 NOTE — Patient Instructions (Signed)
Access Code: [email or web address]  URL: [email or web address]  Date: 11/16/2018  Prepared by: Lyndee Hensen   Exercises Seated Cervical Sidebending Stretch - 3 reps - 30 hold - 3x daily Seated Levator Scapulae Stretch - 3 reps - 30 hold - 2x daily Wrist Extension Stretch Pronated - 3 reps - 30 hold - 2x daily Wrist Prayer Stretch - 3 reps - 30 hold - 2x daily Seated Wrist Extension with Overpressure - 3 reps - 30 hold - 2x daily Seated Cervical Retraction - 10 reps - 2 sets - 2x daily Neck Rotation - 5 reps - 1 sets - 2x daily Standing Backward Shoulder Rolls - 10 reps - 2 sets - 2x daily Standing Scapular Retraction - 10 reps - 2 sets - 2x daily

## 2018-11-16 NOTE — Therapy (Signed)
[facility] [address] [place], [place], [address] Phone: [phone]   Fax:  [phone]  Physical Therapy Treatment  Patient Details  Name: Kyira Volkert MRN: [id number] Date of Birth: 1970/10/07 Referring Provider (PT): Kris Mouton   Encounter Date: 11/16/2018  PT End of Session - 11/16/18 5427    Visit Number  3    Number of Visits  12    Date for PT Re-Evaluation  12/16/18    Authorization Type  BCBS    PT Start Time  1600    PT Stop Time  0623    PT Time Calculation (min)  41 min    Activity Tolerance  Patient tolerated treatment well    Behavior During Therapy  [facility] for tasks assessed/performed       Past Medical History:  Diagnosis Date  . Arthritis    osteo arthritist  . Heart murmur   . History of chicken pox   . History of recurrent UTIs   . PONV (postoperative nausea and vomiting)   . Screening for HIV (human immunodeficiency virus)     Past Surgical History:  Procedure Laterality Date  . BREAST LUMPECTOMY WITH RADIOACTIVE SEED LOCALIZATION Left 05/13/2013   Procedure: BREAST LUMPECTOMY WITH RADIOACTIVE SEED LOCALIZATION;  Surgeon: Adin Hector, MD;  Location: [facility];  Service: General;  Laterality: Left;  . BREAST SURGERY     breast augmentation 08/1998  . DILATION AND CURETTAGE OF UTERUS  02/2009  . ENDOMETRIAL BIOPSY  08/1998  . [place]  . TONSILLECTOMY     1970    There were no vitals filed for this visit.  Subjective Assessment - 11/16/18 1643    Subjective  States continues soreness, does think that pain is slightly better.    Currently in Pain?  Yes    Pain Score  4     Pain Location  Shoulder    Pain Orientation  Left    Pain Descriptors / Indicators  Tightness;Dull    Pain Type  Acute pain    Pain Onset  1 to 4 weeks ago    Pain Frequency  Intermittent    Pain Score  4    Pain Location  Arm    Pain Orientation  Left    Pain Descriptors /  Indicators  Burning;Pins and needles    Pain Type  Acute pain    Pain Onset  1 to 4 weeks ago    Pain Frequency  Intermittent                       [facility] Adult PT Treatment/Exercise - 11/16/18 1608      Neck Exercises: Theraband   Rows  20 reps;Red      Neck Exercises: Seated   Neck Retraction  10 reps    Cervical Rotation  5 reps    Shoulder Rolls  10 reps    Other Seated Exercise  scap squeeze (for HEP)     Other Seated Exercise  Shoulder Pulley flex x2 min, Abd x1 min;       Neck Exercises: Supine   Neck Retraction  10 reps      Modalities   Modalities  Moist Heat      Manual Therapy   Manual Therapy  Joint mobilization;Manual Traction;Passive ROM;Soft tissue mobilization    Manual therapy comments  --    Joint Mobilization  --  Soft tissue mobilization  to L UT, levator and rhomboid, Sub occipitals.     Passive ROM  Manual stretch for UT and levator     Manual Traction  10 sec x10 for c-spine       Neck Exercises: Stretches   Upper Trapezius Stretch  2 reps;30 seconds;Right;Left    Levator Stretch  2 reps;30 seconds;Left    Chest Stretch  60 seconds   Supine Pec stretch  with nerve glides x20            PT Education - 11/16/18 1644    Education Details  Updated HEP    Person(s) Educated  Patient    Methods  Explanation;Handout    Comprehension  Verbalized understanding       PT Short Term Goals - 11/08/18 2118      PT SHORT TERM GOAL #1   Title  Pt to be independent with initial HEP    Time  2    Period  Weeks    Status  New    Target Date  11/18/18      PT SHORT TERM GOAL #2   Title  Pt to report decreased radicular pain in L UE to 4/10    Time  2    Period  Weeks    Status  New    Target Date  11/18/18        PT Long Term Goals - 11/08/18 2119      PT LONG TERM GOAL #1   Title  Pt to be independent with final HEP    Time  6    Period  Weeks    Status  New    Target Date  12/16/18      PT LONG TERM GOAL #2    Title  Pt to report decreased pain in L shoulder and UE to 0-2/10 with activity    Time  6    Period  Weeks    Status  New    Target Date  12/16/18      PT LONG TERM GOAL #3   Title  Pt to demo increased strength of L shoulder and UE to at least 4+/5 to improve ability for lifting, carrying, and IADLs.    Time  6    Period  Weeks    Status  New    Target Date  12/16/18      PT LONG TERM GOAL #4   Title  Pt to have soft tissue restrictions in neck and shoulder to be WNL, for improved ROM and pain    Time  6    Period  Weeks    Status  New    Target Date  12/16/18            Plan - 11/16/18 1645    Clinical Impression Statement  Focus on manual therapy for distraction as well as improving muscle tightness and spasms. Pt declines Dry needling today, but may be helpful to do again in future. + neural tension with supine pec stretch and nerve glides. Pt to benefit from continued manual for reducing pain , spasm, and radicular pain.    Examination-Activity Limitations  Transfers;Reach Overhead;Sit;Carry;Dressing;Hygiene/Grooming;Stand;Lift    Examination-Participation Restrictions  Meal Prep;Cleaning;Community Activity;Driving;Shop;Laundry;Yard Work    Conservation officer, historic buildings  Evolving/Moderate complexity    Rehab Potential  Good    PT Frequency  2x / week    PT Duration  6 weeks    PT Treatment/Interventions  ADLs/Self Care  Home Management;Cryotherapy;Electrical Stimulation;DME Instruction;Ultrasound;Traction;Moist Heat;Iontophoresis 4mg /ml Dexamethasone;Gait training;Stair training;Functional mobility training;Therapeutic activities;Therapeutic exercise;Neuromuscular re-education;Manual techniques;Patient/family education;Passive range of motion;Dry needling;Joint Manipulations;Spinal Manipulations;Taping    Consulted and Agree with Plan of Care  Patient       Patient will benefit from skilled therapeutic intervention in order to improve the following deficits and  impairments:  Decreased range of motion, Impaired UE functional use, Increased muscle spasms, Decreased activity tolerance, Pain, Impaired flexibility, Improper body mechanics, Decreased mobility, Decreased strength  Visit Diagnosis: Radiculopathy, cervical region  Other muscle spasm  Acute pain of left shoulder     Problem List Patient Active Problem List   Diagnosis Date Noted  . Abnormal mammogram 05/02/2013    Sedalia MutaLauren Lois Ostrom, PT, DPT 4:49 PM  11/16/18    [place] [facility] [address] [place], [place], [address] Phone: [phone]   Fax:  [phone]  Name: Katrine CohoJanell Feldt MRN: [id number] Date of Birth: 03/10/1970

## 2018-11-17 DIAGNOSIS — Z1159 Encounter for screening for other viral diseases: Secondary | ICD-10-CM | POA: Diagnosis not present

## 2018-11-24 ENCOUNTER — Encounter: Payer: BC Managed Care – PPO | Admitting: Physical Therapy

## 2018-11-30 ENCOUNTER — Encounter: Payer: Self-pay | Admitting: Physical Therapy

## 2018-11-30 ENCOUNTER — Other Ambulatory Visit: Payer: Self-pay

## 2018-11-30 ENCOUNTER — Ambulatory Visit: Payer: BC Managed Care – PPO | Admitting: Physical Therapy

## 2018-11-30 DIAGNOSIS — M62838 Other muscle spasm: Secondary | ICD-10-CM | POA: Diagnosis not present

## 2018-11-30 DIAGNOSIS — M5412 Radiculopathy, cervical region: Secondary | ICD-10-CM | POA: Diagnosis not present

## 2018-11-30 DIAGNOSIS — M25512 Pain in left shoulder: Secondary | ICD-10-CM

## 2018-11-30 NOTE — Therapy (Signed)
[facility] [address] [place], [place], [address] Phone: [phone]   Fax:  [phone]  Physical Therapy Treatment  Patient Details  Name: Rhonda CohoJanell Damewood MRN: [id number] Date of Birth: 10/28/1970 Referring Provider (PT): Mickey FarberAlison Wolfe   Encounter Date: 11/30/2018  PT End of Session - 11/30/18 1619    Visit Number  4    Number of Visits  12    Date for PT Re-Evaluation  12/16/18    Authorization Type  BCBS    PT Start Time  1440    PT Stop Time  1518    PT Time Calculation (min)  38 min    Activity Tolerance  Patient tolerated treatment well    Behavior During Therapy  [facility] for tasks assessed/performed       Past Medical History:  Diagnosis Date  . Arthritis    osteo arthritist  . Heart murmur   . History of chicken pox   . History of recurrent UTIs   . PONV (postoperative nausea and vomiting)   . Screening for HIV (human immunodeficiency virus)     Past Surgical History:  Procedure Laterality Date  . BREAST LUMPECTOMY WITH RADIOACTIVE SEED LOCALIZATION Left 05/13/2013   Procedure: BREAST LUMPECTOMY WITH RADIOACTIVE SEED LOCALIZATION;  Surgeon: Ernestene MentionHaywood M Ingram, MD;  Location: [facility];  Service: General;  Laterality: Left;  . BREAST SURGERY     breast augmentation 08/1998  . DILATION AND CURETTAGE OF UTERUS  02/2009  . ENDOMETRIAL BIOPSY  08/1998  . MANDIBLE FRACTURE SURGERY     1988  . TONSILLECTOMY     1970    There were no vitals filed for this visit.  Subjective Assessment - 11/30/18 1617    Subjective  Pt states that she is noticing improvements in pain, UE tingling, and tightness.    Currently in Pain?  Yes    Pain Score  3     Pain Location  Shoulder    Pain Orientation  Left    Pain Descriptors / Indicators  Tightness;Dull    Pain Type  Acute pain    Pain Onset  More than a month ago    Pain Frequency  Intermittent    Multiple Pain Sites  Yes    Pain Score  2    Pain Location  Arm     Pain Orientation  Left    Pain Descriptors / Indicators  Pins and needles    Pain Type  Acute pain    Pain Onset  More than a month ago    Pain Frequency  Intermittent                       [facility] Adult PT Treatment/Exercise - 11/30/18 0001      Neck Exercises: Standing   Other Standing Exercises  Row GTB x20; Low Row YTB x20;  Bicep curl 3lb x15;  Supine flys 2lb x20;       Neck Exercises: Seated   Neck Retraction  10 reps      Modalities   Modalities  Traction      Traction   Type of Traction  Cervical    Min (lbs)  10    Max (lbs)  16    Hold Time  5 min x 2    Time  10      Manual Therapy   Manual therapy comments  skilled palpation and monitoring of soft tissue with dry needling.  Soft tissue mobilization  to L UT, levator and rhomboid,     Passive ROM  Manual stretch for UT and levator        Trigger Point Dry Needling - 11/30/18 0001    Consent Given?  Yes    Education Handout Provided  Previously provided    Muscles Treated Head and Neck  Upper trapezius;Levator scapulae    Upper Trapezius Response  Twitch reponse elicited;Palpable increased muscle length    Levator Scapulae Response  Twitch response elicited;Palpable increased muscle length           PT Education - 11/30/18 1618    Education Details  Added UE strength, bicep curl, gripping, and ROws to HEP    Person(s) Educated  Patient    Methods  Explanation;Demonstration;Verbal cues    Comprehension  Verbalized understanding;Returned demonstration;Verbal cues required       PT Short Term Goals - 11/30/18 1619      PT SHORT TERM GOAL #1   Title  Pt to be independent with initial HEP    Time  2    Period  Weeks    Status  Achieved    Target Date  11/18/18      PT SHORT TERM GOAL #2   Title  Pt to report decreased radicular pain in L UE to 4/10    Time  2    Period  Weeks    Status  Achieved    Target Date  11/18/18        PT Long Term Goals - 11/30/18 1619       PT LONG TERM GOAL #1   Title  Pt to be independent with final HEP    Time  6    Period  Weeks    Status  New      PT LONG TERM GOAL #2   Title  Pt to report decreased pain in L shoulder and UE to 0-2/10 with activity    Time  6    Period  Weeks    Status  New      PT LONG TERM GOAL #3   Title  Pt to demo increased strength of L shoulder and UE to at least 4+/5 to improve ability for lifting, carrying, and IADLs.    Time  6    Period  Weeks    Status  New      PT LONG TERM GOAL #4   Title  Pt to have soft tissue restrictions in neck and shoulder to be WNL, for improved ROM and pain    Time  6    Period  Weeks    Status  New            Plan - 11/30/18 1622    Clinical Impression Statement  Pt with noted improvements in pain and UE radicular symptoms. Much decreased nerve tension in UE with nerve glides. Continues to have tightness in L UT region, DN done today to decrease. Mechanical traction done for continuing to reduce nerve tension and pain. Pt making good progress. Light strengthening progressed for UE today, will benefit from continued progression.    Examination-Activity Limitations  Transfers;Reach Overhead;Sit;Carry;Dressing;Hygiene/Grooming;Stand;Lift    Examination-Participation Restrictions  Meal Prep;Cleaning;Community Activity;Driving;Shop;Laundry;Yard Work    Merchant navy officer  Evolving/Moderate complexity    Rehab Potential  Good    PT Frequency  2x / week    PT Duration  6 weeks    PT Treatment/Interventions  ADLs/Self Care Home Management;Cryotherapy;Dealer  Stimulation;DME Instruction;Ultrasound;Traction;Moist Heat;Iontophoresis 4mg /ml Dexamethasone;Gait training;Stair training;Functional mobility training;Therapeutic activities;Therapeutic exercise;Neuromuscular re-education;Manual techniques;Patient/family education;Passive range of motion;Dry needling;Joint Manipulations;Spinal Manipulations;Taping    Consulted and Agree with Plan of  Care  Patient       Patient will benefit from skilled therapeutic intervention in order to improve the following deficits and impairments:  Decreased range of motion, Impaired UE functional use, Increased muscle spasms, Decreased activity tolerance, Pain, Impaired flexibility, Improper body mechanics, Decreased mobility, Decreased strength  Visit Diagnosis: Radiculopathy, cervical region  Other muscle spasm  Acute pain of left shoulder     Problem List Patient Active Problem List   Diagnosis Date Noted  . Abnormal mammogram 05/02/2013    05/04/2013, PT, DPT 4:27 PM  11/30/18    [facility] [address] [place], [place], [place] Phone: [phone]   Fax:  [phone]  Name: [name] MRN: [name] Date of Birth: Jul 13, 1970

## 2018-12-06 ENCOUNTER — Ambulatory Visit (INDEPENDENT_AMBULATORY_CARE_PROVIDER_SITE_OTHER): Payer: BC Managed Care – PPO | Admitting: Physical Therapy

## 2018-12-06 ENCOUNTER — Other Ambulatory Visit: Payer: Self-pay

## 2018-12-06 DIAGNOSIS — M25512 Pain in left shoulder: Secondary | ICD-10-CM

## 2018-12-06 DIAGNOSIS — M5412 Radiculopathy, cervical region: Secondary | ICD-10-CM

## 2018-12-06 DIAGNOSIS — M62838 Other muscle spasm: Secondary | ICD-10-CM

## 2018-12-08 ENCOUNTER — Encounter: Payer: Self-pay | Admitting: Physical Therapy

## 2018-12-08 ENCOUNTER — Other Ambulatory Visit: Payer: Self-pay

## 2018-12-08 ENCOUNTER — Ambulatory Visit (INDEPENDENT_AMBULATORY_CARE_PROVIDER_SITE_OTHER): Payer: BC Managed Care – PPO | Admitting: Physical Therapy

## 2018-12-08 DIAGNOSIS — M5412 Radiculopathy, cervical region: Secondary | ICD-10-CM

## 2018-12-08 DIAGNOSIS — M25512 Pain in left shoulder: Secondary | ICD-10-CM | POA: Diagnosis not present

## 2018-12-08 DIAGNOSIS — M62838 Other muscle spasm: Secondary | ICD-10-CM | POA: Diagnosis not present

## 2018-12-08 NOTE — Therapy (Signed)
[facility] [address] [place], [place], [address] Phone: [phone]   Fax:  [phone]  Physical Therapy Treatment  Patient Details  Name: [name] MRN: [id number] Date of Birth: 15-Dec-1970 Referring Provider (PT): Kris Mouton   Encounter Date: 12/08/2018  PT End of Session - 12/08/18 1522    Visit Number  6    Number of Visits  12    Date for PT Re-Evaluation  12/16/18    Authorization Type  BCBS    PT Start Time  6160    PT Stop Time  1557    PT Time Calculation (min)  40 min    Activity Tolerance  Patient tolerated treatment well    Behavior During Therapy  [facility] for tasks assessed/performed       Past Medical History:  Diagnosis Date  . Arthritis    osteo arthritist  . Heart murmur   . History of chicken pox   . History of recurrent UTIs   . PONV (postoperative nausea and vomiting)   . Screening for HIV (human immunodeficiency virus)     Past Surgical History:  Procedure Laterality Date  . BREAST LUMPECTOMY WITH RADIOACTIVE SEED LOCALIZATION Left 05/13/2013   Procedure: BREAST LUMPECTOMY WITH RADIOACTIVE SEED LOCALIZATION;  Surgeon: Adin Hector, MD;  Location: [facility];  Service: General;  Laterality: Left;  . BREAST SURGERY     breast augmentation 08/1998  . DILATION AND CURETTAGE OF UTERUS  02/2009  . ENDOMETRIAL BIOPSY  08/1998  . [place]  . TONSILLECTOMY     1970    There were no vitals filed for this visit.  Subjective Assessment - 12/08/18 1520    Subjective  Pt states much better, states 80% better. States little/no pain in neck, and just mild tingling into fingers. No pain in neck after traction last visit.    Currently in Pain?  No/denies    Pain Score  0-No pain    Pain Score  2    Pain Location  Finger (Comment which one)    Pain Orientation  Left    Pain Descriptors / Indicators  Pins and needles    Pain Type  Acute pain    Pain Onset  More  than a month ago    Pain Frequency  Intermittent                       [facility] Adult PT Treatment/Exercise - 12/08/18 1523      Neck Exercises: Standing   Other Standing Exercises  Row GTB x20; Low Row YTB x20;  Bicep curl RTB 15B ;  Wall push ups x20;     Other Standing Exercises  Shoulder AROM Flex and abd x10 each bil       Neck Exercises: Seated   Neck Retraction  --      Neck Exercises: Supine   Other Supine Exercise  --      Modalities   Modalities  Traction      Traction   Type of Traction  Cervical    Min (lbs)  10    Max (lbs)  16    Hold Time  5 min x 2    Time  10      Manual Therapy   Soft tissue mobilization  STM/DTM to L UT, levator     Passive ROM  Manual stretch for UT and levator  Neck Exercises: Stretches   Other Neck Stretches  Doorway stretch 45 deg and 90 deg 30 sec x2 bi               PT Short Term Goals - 11/30/18 1619      PT SHORT TERM GOAL #1   Title  Pt to be independent with initial HEP    Time  2    Period  Weeks    Status  Achieved    Target Date  11/18/18      PT SHORT TERM GOAL #2   Title  Pt to report decreased radicular pain in L UE to 4/10    Time  2    Period  Weeks    Status  Achieved    Target Date  11/18/18        PT Long Term Goals - 11/30/18 1619      PT LONG TERM GOAL #1   Title  Pt to be independent with final HEP    Time  6    Period  Weeks    Status  New      PT LONG TERM GOAL #2   Title  Pt to report decreased pain in L shoulder and UE to 0-2/10 with activity    Time  6    Period  Weeks    Status  New      PT LONG TERM GOAL #3   Title  Pt to demo increased strength of L shoulder and UE to at least 4+/5 to improve ability for lifting, carrying, and IADLs.    Time  6    Period  Weeks    Status  New      PT LONG TERM GOAL #4   Title  Pt to have soft tissue restrictions in neck and shoulder to be WNL, for improved ROM and pain    Time  6    Period  Weeks    Status  New             Plan - 12/08/18 1551    Clinical Impression Statement  Pt able to progress light strengthening today without increased pain. Pt with full Shoulder and neck ROM without pain. She does have onset ofL UE symptoms with prolonged L SB (with R UT stretch). Traction continued for radicular pain. Pt progressing well.    Examination-Activity Limitations  Transfers;Reach Overhead;Sit;Carry;Dressing;Hygiene/Grooming;Stand;Lift    Examination-Participation Restrictions  Meal Prep;Cleaning;Community Activity;Driving;Shop;Laundry;Yard Work    Conservation officer, historic buildingstability/Clinical Decision Making  Evolving/Moderate complexity    Rehab Potential  Good    PT Frequency  2x / week    PT Duration  6 weeks    PT Treatment/Interventions  ADLs/Self Care Home Management;Cryotherapy;Electrical Stimulation;DME Instruction;Ultrasound;Traction;Moist Heat;Iontophoresis 4mg /ml Dexamethasone;Gait training;Stair training;Functional mobility training;Therapeutic activities;Therapeutic exercise;Neuromuscular re-education;Manual techniques;Patient/family education;Passive range of motion;Dry needling;Joint Manipulations;Spinal Manipulations;Taping    Consulted and Agree with Plan of Care  Patient       Patient will benefit from skilled therapeutic intervention in order to improve the following deficits and impairments:  Decreased range of motion, Impaired UE functional use, Increased muscle spasms, Decreased activity tolerance, Pain, Impaired flexibility, Improper body mechanics, Decreased mobility, Decreased strength  Visit Diagnosis: Radiculopathy, cervical region  Other muscle spasm  Acute pain of left shoulder     Problem List Patient Active Problem List   Diagnosis Date Noted  . Abnormal mammogram 05/02/2013    Sedalia MutaLauren Maurico Perrell, PT, DPT 3:53 PM  12/08/18    [facility]  [address] [place], [place], [address] Phone: [phone]   Fax:  [phone]  Name: [name] MRN: [id number] Date of Birth: August 22, 1970

## 2018-12-08 NOTE — Therapy (Signed)
[facility] [address] [place], [place], [address] Phone: [phone]   Fax:  [phone]  Physical Therapy Treatment  Patient Details  Name: [name] MRN: [id number] Date of Birth: 09-Oct-1970 Referring Provider (PT): [name]   Encounter Date: 12/06/2018  PT End of Session - 12/08/18 1200    Visit Number  5    Number of Visits  12    Date for PT Re-Evaluation  12/16/18    Authorization Type  BCBS    PT Start Time  1435    PT Stop Time  1514    PT Time Calculation (min)  39 min    Activity Tolerance  Patient tolerated treatment well    Behavior During Therapy  [facility] for tasks assessed/performed       Past Medical History:  Diagnosis Date  . Arthritis    osteo arthritist  . Heart murmur   . History of chicken pox   . History of recurrent UTIs   . PONV (postoperative nausea and vomiting)   . Screening for HIV (human immunodeficiency virus)     Past Surgical History:  Procedure Laterality Date  . BREAST LUMPECTOMY WITH RADIOACTIVE SEED LOCALIZATION Left 05/13/2013   Procedure: BREAST LUMPECTOMY WITH RADIOACTIVE SEED LOCALIZATION;  Surgeon: Ernestene Mention, MD;  Location: [facility];  Service: General;  Laterality: Left;  . BREAST SURGERY     breast augmentation 08/1998  . DILATION AND CURETTAGE OF UTERUS  02/2009  . ENDOMETRIAL BIOPSY  08/1998  . MANDIBLE FRACTURE SURGERY     1988  . TONSILLECTOMY     1970    There were no vitals filed for this visit.  Subjective Assessment - 12/08/18 1159    Subjective  Pt states much improvement of pain in neck and in UE.    Patient Stated Goals  Decreased pain    Currently in Pain?  Yes    Pain Score  2     Pain Location  Shoulder    Pain Orientation  Left    Pain Descriptors / Indicators  Tightness;Dull    Pain Type  Acute pain    Pain Onset  More than a month ago    Pain Frequency  Intermittent    Pain Score  2    Pain Location  Arm    Pain  Orientation  Left    Pain Descriptors / Indicators  Pins and needles    Pain Type  Acute pain    Pain Onset  More than a month ago    Pain Frequency  Intermittent                       [facility] Adult PT Treatment/Exercise - 12/08/18 0001      Neck Exercises: Standing   Other Standing Exercises  Row GTB x20; Low Row YTB x20;  Bicep curl 3lb x15;  Supine flys 2lb x20;       Neck Exercises: Seated   Neck Retraction  10 reps      Neck Exercises: Supine   Other Supine Exercise  Shoulder flexion x10 AROM      Modalities   Modalities  Traction      Traction   Type of Traction  Cervical    Min (lbs)  10    Max (lbs)  16    Hold Time  5 min x 2    Time  10      Manual  Therapy   Soft tissue mobilization  STM/DTM to L UT, levator and rhomboid,     Passive ROM  Manual stretch for UT and levator       Neck Exercises: Stretches   Other Neck Stretches  Doorway stretch 45 deg and 90 deg 30 sec x2 bi               PT Short Term Goals - 11/30/18 1619      PT SHORT TERM GOAL #1   Title  Pt to be independent with initial HEP    Time  2    Period  Weeks    Status  Achieved    Target Date  11/18/18      PT SHORT TERM GOAL #2   Title  Pt to report decreased radicular pain in L UE to 4/10    Time  2    Period  Weeks    Status  Achieved    Target Date  11/18/18        PT Long Term Goals - 11/30/18 1619      PT LONG TERM GOAL #1   Title  Pt to be independent with final HEP    Time  6    Period  Weeks    Status  New      PT LONG TERM GOAL #2   Title  Pt to report decreased pain in L shoulder and UE to 0-2/10 with activity    Time  6    Period  Weeks    Status  New      PT LONG TERM GOAL #3   Title  Pt to demo increased strength of L shoulder and UE to at least 4+/5 to improve ability for lifting, carrying, and IADLs.    Time  6    Period  Weeks    Status  New      PT LONG TERM GOAL #4   Title  Pt to have soft tissue restrictions in neck and  shoulder to be WNL, for improved ROM and pain    Time  6    Period  Weeks    Status  New            Plan - 12/08/18 1201    Clinical Impression Statement  Pt making good progress. She has improving cervical ROM, pain and improving pain in UE. She has tightness in L UT that continues, but much decreased pain with palpation. Continued traction today for pain and arm pain. Plan to progress as tolerated.    Examination-Activity Limitations  Transfers;Reach Overhead;Sit;Carry;Dressing;Hygiene/Grooming;Stand;Lift    Examination-Participation Restrictions  Meal Prep;Cleaning;Community Activity;Driving;Shop;Laundry;Yard Work    Merchant navy officer  Evolving/Moderate complexity    Rehab Potential  Good    PT Frequency  2x / week    PT Duration  6 weeks    PT Treatment/Interventions  ADLs/Self Care Home Management;Cryotherapy;Electrical Stimulation;DME Instruction;Ultrasound;Traction;Moist Heat;Iontophoresis 4mg /ml Dexamethasone;Gait training;Stair training;Functional mobility training;Therapeutic activities;Therapeutic exercise;Neuromuscular re-education;Manual techniques;Patient/family education;Passive range of motion;Dry needling;Joint Manipulations;Spinal Manipulations;Taping    Consulted and Agree with Plan of Care  Patient       Patient will benefit from skilled therapeutic intervention in order to improve the following deficits and impairments:  Decreased range of motion, Impaired UE functional use, Increased muscle spasms, Decreased activity tolerance, Pain, Impaired flexibility, Improper body mechanics, Decreased mobility, Decreased strength  Visit Diagnosis: Radiculopathy, cervical region  Other muscle spasm  Acute pain of left shoulder     Problem List  Patient Active Problem List   Diagnosis Date Noted  . Abnormal mammogram 05/02/2013    Sedalia MutaLauren Talani Brazee, PT, DPT 12:04 PM  12/08/18    [facility] [address] [place], [place], [address] Phone: [phone]   Fax:  [phone]  Name: [name] MRN: [id number] Date of Birth: 08/12/1970

## 2018-12-20 ENCOUNTER — Other Ambulatory Visit: Payer: Self-pay

## 2018-12-20 ENCOUNTER — Ambulatory Visit (INDEPENDENT_AMBULATORY_CARE_PROVIDER_SITE_OTHER): Payer: BC Managed Care – PPO | Admitting: Physical Therapy

## 2018-12-20 ENCOUNTER — Encounter: Payer: Self-pay | Admitting: Physical Therapy

## 2018-12-20 DIAGNOSIS — M25512 Pain in left shoulder: Secondary | ICD-10-CM

## 2018-12-20 DIAGNOSIS — M62838 Other muscle spasm: Secondary | ICD-10-CM

## 2018-12-20 DIAGNOSIS — M5412 Radiculopathy, cervical region: Secondary | ICD-10-CM

## 2018-12-21 ENCOUNTER — Encounter: Payer: Self-pay | Admitting: Physical Therapy

## 2018-12-21 NOTE — Therapy (Signed)
[facility] [address] [place], [place], [address] Phone: [phone]   Fax:  [phone]  Physical Therapy Treatment  Patient Details  Name: [name] MRN: [id number] Date of Birth: Nov 15, 1970 Referring Provider (PT): Kris Mouton   Encounter Date: 12/20/2018  PT End of Session - 12/21/18 0954    Visit Number  7    Number of Visits  20    Date for PT Re-Evaluation  01/31/19    Authorization Type  BCBS    PT Start Time  1432    PT Stop Time  1515    PT Time Calculation (min)  43 min    Activity Tolerance  Patient tolerated treatment well    Behavior During Therapy  [facility] for tasks assessed/performed       Past Medical History:  Diagnosis Date  . Arthritis    osteo arthritist  . Heart murmur   . History of chicken pox   . History of recurrent UTIs   . PONV (postoperative nausea and vomiting)   . Screening for HIV (human immunodeficiency virus)     Past Surgical History:  Procedure Laterality Date  . BREAST LUMPECTOMY WITH RADIOACTIVE SEED LOCALIZATION Left 05/13/2013   Procedure: BREAST LUMPECTOMY WITH RADIOACTIVE SEED LOCALIZATION;  Surgeon: Adin Hector, MD;  Location: [facility];  Service: General;  Laterality: Left;  . BREAST SURGERY     breast augmentation 08/1998  . DILATION AND CURETTAGE OF UTERUS  02/2009  . ENDOMETRIAL BIOPSY  08/1998  . [place]  . TONSILLECTOMY     1970    There were no vitals filed for this visit.  Subjective Assessment - 12/21/18 0953    Subjective  Pt states much improvements. main complaint is still Tightness in bil UT and neck region. She has only mild tingling into fingers at times.    Currently in Pain?  Yes    Pain Score  1     Pain Location  Arm    Pain Orientation  Left    Pain Descriptors / Indicators  Tingling    Pain Type  Acute pain    Pain Onset  More than a month ago    Pain Frequency  Intermittent         [facility] PT  Assessment - 12/21/18 0001      AROM   Overall AROM Comments  Shoulder: WNL, Cervical: mild tightness with L/R rotation and SB       Strength   Overall Strength Comments  4+/5 IR/ER, 4/5 flex/abd      Palpation   Palpation comment  Mod tightness and limitation in Bil UTs.                    [facility] Adult PT Treatment/Exercise - 12/21/18 0001      Neck Exercises: Machines for Strengthening   UBE (Upper Arm Bike)  x4 min      Neck Exercises: Standing   Other Standing Exercises  Row GTB x20; Low Row YTB x20;  Scap pull outs RTB x15;  Wall push ups x20;     Other Standing Exercises  Shoulder AROM Flex and abd x10 each bil , at wall for posture       Modalities   Modalities  Traction      Traction   Type of Traction  Cervical    Min (lbs)  12    Max (lbs)  17  Hold Time  5 min x 2    Time  10      Manual Therapy   Manual therapy comments  skilled palpation and monitoring of soft tissue with dry needling.     Joint Mobilization  PA mobs for c-spine    Soft tissue mobilization  STM/DTM to bil UT, levator     Passive ROM  Manual stretch for UT and levator       Neck Exercises: Stretches   Upper Trapezius Stretch  2 reps;30 seconds;Right;Left       Trigger Point Dry Needling - 12/21/18 0001    Consent Given?  Yes    Education Handout Provided  Previously provided    Muscles Treated Head and Neck  Upper trapezius;Levator scapulae    Upper Trapezius Response  Twitch reponse elicited;Palpable increased muscle length   L   Levator Scapulae Response  Twitch response elicited;Palpable increased muscle length   L          PT Education - 12/21/18 0954    Education Details  Reviewed HEP    Person(s) Educated  Patient    Methods  Explanation;Demonstration    Comprehension  Verbalized understanding;Returned demonstration       PT Short Term Goals - 11/30/18 1619      PT SHORT TERM GOAL #1   Title  Pt to be independent with initial HEP    Time  2    Period   Weeks    Status  Achieved    Target Date  11/18/18      PT SHORT TERM GOAL #2   Title  Pt to report decreased radicular pain in L UE to 4/10    Time  2    Period  Weeks    Status  Achieved    Target Date  11/18/18        PT Long Term Goals - 12/21/18 0956      PT LONG TERM GOAL #1   Title  Pt to be independent with final HEP    Time  6    Period  Weeks    Status  Partially Met    Target Date  01/31/19      PT LONG TERM GOAL #2   Title  Pt to report decreased pain and tingling in L shoulder and UE to 0-2/10 with activity    Time  6    Period  Weeks    Status  Revised    Target Date  01/31/19      PT LONG TERM GOAL #3   Title  Pt to demo increased strength of L shoulder and UE to at least 4+/5 to improve ability for lifting, carrying, and IADLs.    Time  6    Period  Weeks    Status  Partially Met    Target Date  01/31/19      PT LONG TERM GOAL #4   Title  Pt to have soft tissue restrictions in neck and shoulder to be WNL, for improved ROM and pain    Time  6    Period  Weeks    Status  On-going    Target Date  01/31/19            Plan - 12/21/18 1003    Clinical Impression Statement  Pt making good progress with pain, and much decreased tingling into hand. She continues to have discomfort caused by significant tightness in cervical musculature. DN done for L  side today, pt declines to have it also done to R side. Other manual done for improving tightness and soreness. Pt improving with functional reach/use of UE, wiht less pain. Pt to benefit from continued care, for traction for nerve pain, as well as decreaseing muslce tightness, and education on HEP.    Examination-Activity Limitations  Transfers;Reach Overhead;Sit;Carry;Dressing;Hygiene/Grooming;Stand;Lift    Examination-Participation Restrictions  Meal Prep;Cleaning;Community Activity;Driving;Shop;Laundry;Yard Work    [employer] officer  Evolving/Moderate complexity    Rehab Potential  Good     PT Frequency  2x / week    PT Duration  6 weeks    PT Treatment/Interventions  ADLs/Self Care Home Management;Cryotherapy;Electrical Stimulation;DME Instruction;Ultrasound;Traction;Moist Heat;Iontophoresis 15m/ml Dexamethasone;Gait training;Stair training;Functional mobility training;Therapeutic activities;Therapeutic exercise;Neuromuscular re-education;Manual techniques;Patient/family education;Passive range of motion;Dry needling;Joint Manipulations;Spinal Manipulations;Taping    Consulted and Agree with Plan of Care  Patient       Patient will benefit from skilled therapeutic intervention in order to improve the following deficits and impairments:  Decreased range of motion, Impaired UE functional use, Increased muscle spasms, Decreased activity tolerance, Pain, Impaired flexibility, Improper body mechanics, Decreased mobility, Decreased strength  Visit Diagnosis: Radiculopathy, cervical region  Other muscle spasm  Acute pain of left shoulder     Problem List Patient Active Problem List   Diagnosis Date Noted  . Abnormal mammogram 05/02/2013   LLyndee Hensen PT, DPT 10:05 AM  12/21/18    [facility] [address]Phone: [phone]  Fax:  [phone] Name: JLonden LorgeMRN: [id number] of Birth: 404/30/1972

## 2018-12-29 ENCOUNTER — Ambulatory Visit (INDEPENDENT_AMBULATORY_CARE_PROVIDER_SITE_OTHER): Payer: BC Managed Care – PPO | Admitting: Physical Therapy

## 2018-12-29 ENCOUNTER — Other Ambulatory Visit: Payer: Self-pay

## 2018-12-29 ENCOUNTER — Encounter: Payer: Self-pay | Admitting: Physical Therapy

## 2018-12-29 DIAGNOSIS — M62838 Other muscle spasm: Secondary | ICD-10-CM

## 2018-12-29 DIAGNOSIS — M25512 Pain in left shoulder: Secondary | ICD-10-CM

## 2018-12-29 DIAGNOSIS — M545 Low back pain, unspecified: Secondary | ICD-10-CM

## 2018-12-29 DIAGNOSIS — M5412 Radiculopathy, cervical region: Secondary | ICD-10-CM | POA: Diagnosis not present

## 2018-12-29 NOTE — Therapy (Signed)
[facility] [address] [place], [place], [address] Phone: [phone]   Fax:  [phone]  Physical Therapy Treatment/Discharge   Patient Details  Name: [name] MRN: [id number] Date of Birth: 12-Apr-1970 Referring Provider (PT): Kris Mouton   Encounter Date: 12/29/2018  PT End of Session - 12/29/18 1113    Visit Number  8    Number of Visits  20    Date for PT Re-Evaluation  01/31/19    Authorization Type  BCBS    PT Start Time  1015    PT Stop Time  1059    PT Time Calculation (min)  44 min    Activity Tolerance  Patient tolerated treatment well    Behavior During Therapy  [facility] for tasks assessed/performed       Past Medical History:  Diagnosis Date  . Arthritis    osteo arthritist  . Heart murmur   . History of chicken pox   . History of recurrent UTIs   . PONV (postoperative nausea and vomiting)   . Screening for HIV (human immunodeficiency virus)     Past Surgical History:  Procedure Laterality Date  . BREAST LUMPECTOMY WITH RADIOACTIVE SEED LOCALIZATION Left 05/13/2013   Procedure: BREAST LUMPECTOMY WITH RADIOACTIVE SEED LOCALIZATION;  Surgeon: Adin Hector, MD;  Location: [facility];  Service: General;  Laterality: Left;  . BREAST SURGERY     breast augmentation 08/1998  . DILATION AND CURETTAGE OF UTERUS  02/2009  . ENDOMETRIAL BIOPSY  08/1998  . [place]  . TONSILLECTOMY     1970    There were no vitals filed for this visit.  Subjective Assessment - 12/29/18 1025    Subjective  Pt states no pain in neck, no tingling in UE. She has low back that has been bothering her.    Currently in Pain?  No/denies    Pain Score  0-No pain                       [facility] Adult PT Treatment/Exercise - 12/29/18 1026      Neck Exercises: Machines for Strengthening   UBE (Upper Arm Bike)  x4 min      Neck Exercises: Standing   Other Standing Exercises  Row  GTB x20;  Bil ER RTB x20; Scap pull outs RTB x20;  Wall push ups x20;     Other Standing Exercises  Shoulder AROM, full flexion x10;  Abd to 90 deg , 2 lb, x20,   scaption to 90 deg, 2lb x20;       Neck Exercises: Supine   Other Supine Exercise  low back (for HEP): childs pose 30 sec x3: SKTC, bridging, TA contraction, Supine march x20 all.       Modalities   Modalities  Traction      Traction   Type of Traction  --    Min (lbs)  --    Max (lbs)  --    Hold Time  --    Time  --      Manual Therapy   Manual therapy comments  --    Joint Mobilization  --    Soft tissue mobilization  --    Passive ROM  --      Neck Exercises: Stretches   Upper Trapezius Stretch  2 reps;30 seconds;Right;Left             PT Education -  12/29/18 1113    Education Details  Final HEP reviewed, discussed care for Low back pain.    Person(s) Educated  Patient    Methods  Explanation;Demonstration;Verbal cues;Handout    Comprehension  Verbalized understanding;Returned demonstration;Verbal cues required;Tactile cues required       PT Short Term Goals - 11/30/18 1619      PT SHORT TERM GOAL #1   Title  Pt to be independent with initial HEP    Time  2    Period  Weeks    Status  Achieved    Target Date  11/18/18      PT SHORT TERM GOAL #2   Title  Pt to report decreased radicular pain in L UE to 4/10    Time  2    Period  Weeks    Status  Achieved    Target Date  11/18/18        PT Long Term Goals - 12/29/18 1114      PT LONG TERM GOAL #1   Title  Pt to be independent with final HEP    Time  6    Period  Weeks    Status  Achieved      PT LONG TERM GOAL #2   Title  Pt to report decreased pain and tingling in L shoulder and UE to 0-2/10 with activity    Time  6    Period  Weeks    Status  Achieved      PT LONG TERM GOAL #3   Title  Pt to demo increased strength of L shoulder and UE to at least 4+/5 to improve ability for lifting, carrying, and IADLs.    Time  6    Period   Weeks    Status  Achieved      PT LONG TERM GOAL #4   Title  Pt to have soft tissue restrictions in neck and shoulder to be WNL, for improved ROM and pain    Time  6    Period  Weeks    Status  Achieved            Plan - 12/29/18 1116    Clinical Impression Statement  Pt has made good progress with neck pain. She reports no UE tingling. She has mild tightness in UT region, that she will continue to address with HEP. Pt has met goals for neck/arm pain, and is ready for d/c to HEP. Final HEP reviewed today. She does complain of low back pain, pt will obtain script for PT for low back as needed.    Examination-Activity Limitations  Transfers;Reach Overhead;Sit;Carry;Dressing;Hygiene/Grooming;Stand;Lift    Examination-Participation Restrictions  Meal Prep;Cleaning;Community Activity;Driving;Shop;Laundry;Yard Work    [employer] officer  Evolving/Moderate complexity    Rehab Potential  Good    PT Frequency  2x / week    PT Duration  6 weeks    PT Treatment/Interventions  ADLs/Self Care Home Management;Cryotherapy;Electrical Stimulation;DME Instruction;Ultrasound;Traction;Moist Heat;Iontophoresis 5m/ml Dexamethasone;Gait training;Stair training;Functional mobility training;Therapeutic activities;Therapeutic exercise;Neuromuscular re-education;Manual techniques;Patient/family education;Passive range of motion;Dry needling;Joint Manipulations;Spinal Manipulations;Taping    Consulted and Agree with Plan of Care  Patient       Patient will benefit from skilled therapeutic intervention in order to improve the following deficits and impairments:  Decreased range of motion, Impaired UE functional use, Increased muscle spasms, Decreased activity tolerance, Pain, Impaired flexibility, Improper body mechanics, Decreased mobility, Decreased strength  Visit Diagnosis: Radiculopathy, cervical region  Other muscle spasm  Acute pain of left shoulder  Problem List Patient  Active Problem List   Diagnosis Date Noted  . Abnormal mammogram 05/02/2013    Lyndee Hensen, PT, DPT 11:23 AM  12/29/18    [facility] [place], [place], [address] Phone: [phone]   Fax:  [phone]  Name: [name] MRN: [id number] Date of Birth: [birth year]     PHYSICAL THERAPY DISCHARGE SUMMARY  Visits from Start of Care: 8 Plan: Patient agrees to discharge.  Patient goals were met. Patient is being discharged due to meeting the stated rehab goals.  ?????     Lyndee Hensen, PT, DPT 11:23 AM  12/29/18

## 2018-12-29 NOTE — Patient Instructions (Signed)
Access Code: [email or web address]  URL: [email or web address]  Date: 12/29/2018  Prepared by: Lyndee Hensen   Exercises Seated Cervical Sidebending Stretch - 3 reps - 30 hold - 3x daily Seated Levator Scapulae Stretch - 3 reps - 30 hold - 2x daily Wrist Prayer Stretch - 3 reps - 30 hold - 2x daily Seated Cervical Retraction - 10 reps - 2 sets - 2x daily Neck Rotation - 5 reps - 1 sets - 2x daily Standing Backward Shoulder Rolls - 10 reps - 2 sets - 2x daily Standing Scapular Retraction - 10 reps - 2 sets - 2x daily Standing Row with Resistance - 10 reps - 2 sets - 1x daily Shoulder External Rotation - 10 reps - 2 sets - 1x daily Standing Shoulder Horizontal Abduction with Resistance - 10 reps - 2 sets - 1x daily Shoulder Scaption at Wall Thumbs Up - 10 reps - 1 sets - 1x daily

## 2019-01-06 ENCOUNTER — Encounter: Payer: Self-pay | Admitting: Physical Therapy

## 2019-01-06 ENCOUNTER — Other Ambulatory Visit: Payer: Self-pay

## 2019-01-06 ENCOUNTER — Ambulatory Visit (INDEPENDENT_AMBULATORY_CARE_PROVIDER_SITE_OTHER): Payer: BC Managed Care – PPO | Admitting: Physical Therapy

## 2019-01-06 DIAGNOSIS — G8929 Other chronic pain: Secondary | ICD-10-CM | POA: Diagnosis not present

## 2019-01-06 DIAGNOSIS — M545 Low back pain: Secondary | ICD-10-CM | POA: Diagnosis not present

## 2019-01-10 ENCOUNTER — Encounter: Payer: Self-pay | Admitting: Physical Therapy

## 2019-01-10 NOTE — Therapy (Signed)
[facility] [address] [place], [place], [address] Phone: [phone]   Fax:  [phone]  Physical Therapy Evaluation  Patient Details  Name: [name] MRN: [id number] Date of Birth: 03/09/1970 Referring Provider (PT): [name]   Encounter Date: 01/06/2019  PT End of Session - 01/10/19 0951    Visit Number  1    Number of Visits  12    Date for PT Re-Evaluation  02/17/19    Authorization Type  BCBS    PT Start Time  1015    PT Stop Time  1100    PT Time Calculation (min)  45 min    Activity Tolerance  Patient tolerated treatment well    Behavior During Therapy  [facility] for tasks assessed/performed       Past Medical History:  Diagnosis Date  . Arthritis    osteo arthritist  . Heart murmur   . History of chicken pox   . History of recurrent UTIs   . PONV (postoperative nausea and vomiting)   . Screening for HIV (human immunodeficiency virus)     Past Surgical History:  Procedure Laterality Date  . BREAST LUMPECTOMY WITH RADIOACTIVE SEED LOCALIZATION Left 05/13/2013   Procedure: BREAST LUMPECTOMY WITH RADIOACTIVE SEED LOCALIZATION;  Surgeon: Ernestene Mention, MD;  Location: [facility];  Service: General;  Laterality: Left;  . BREAST SURGERY     breast augmentation 08/1998  . DILATION AND CURETTAGE OF UTERUS  02/2009  . ENDOMETRIAL BIOPSY  08/1998  . MANDIBLE FRACTURE SURGERY     1988  . TONSILLECTOMY     1970    There were no vitals filed for this visit.   Subjective Assessment - 01/10/19 0949    Subjective  Pt seen previously for neck pain, now being evaled for Back pain. Has had chronic back pain, aggravated at times. Most recently a few months ago. Most pain with getting out of bed, and initial standing. Bilateral, low back. Not doing HEP for back. Pt works as Teacher, music hold activities    Patient Stated Goals  Decreased pain    Currently in Pain?   Yes    Pain Score  7     Pain Location  Back    Pain Orientation  Left    Pain Descriptors / Indicators  Aching    Pain Type  Chronic pain    Pain Radiating Towards  pain 0 at times, but up to 6-7/10 with transfers and initial motion    Pain Onset  More than a month ago    Pain Frequency  Intermittent    Aggravating Factors   driving, increased activity, also no reason, hurst at rest         [facility] PT Assessment - 01/10/19 0001      Assessment   Medical Diagnosis  Back Pain    Referring Provider (PT)  [name]    Hand Dominance  Right    Prior Therapy  Yes for Neck      Balance Screen   Has the patient fallen in the past 6 months  No      Prior Function   Level of Independence  Independent      Cognition   Overall Cognitive Status  Within Functional Limits for tasks assessed      AROM   Overall AROM Comments  Hips: [facility]     AROM Assessment Site  Lumbar  Lumbar Flexion  mod limitation    Lumbar Extension  mild limitation/pain    Lumbar - Right Side Bend  mild limitation    Lumbar - Left Side Bend  mild limitation      Strength   Overall Strength Comments  Hips: 4-/5,  Core: 3/5     Right Hand Grip (lbs)  --    Left Hand Grip (lbs)  --      Palpation   Palpation comment  Significant tightness in bil lumbar paraspinals, QL, soreness in low lumbar region, SI,       Special Tests   Other special tests  Neg SLR, Neg Fabir/Fadir                 Objective measurements completed on examination: See above findings.      [facility] Adult PT Treatment/Exercise - 01/10/19 0001      Exercises   Exercises  Lumbar      Lumbar Exercises: Stretches   Double Knee to Chest Stretch  3 reps;30 seconds    Other Lumbar Stretch Exercise  Childs Pose L, R, Center 30 sec x2 each;     Other Lumbar Stretch Exercise  Stadig QL at wall 30 sec x2 bil;       Lumbar Exercises: Supine   Pelvic Tilt  20 reps    Straight Leg Raise  10 reps    Straight Leg Raises Limitations   bil      Lumbar Exercises: Sidelying   Hip Abduction  10 reps;Both             PT Education - 01/10/19 0951    Education Details  Initial HEP for back, Exam findings, POC    Person(s) Educated  Patient    Methods  Explanation;Demonstration;Tactile cues;Verbal cues;Handout    Comprehension  Verbalized understanding;Returned demonstration;Verbal cues required;Tactile cues required;Need further instruction       PT Short Term Goals - 01/10/19 0953      PT SHORT TERM GOAL #1   Title  Pt to be independent with initial HEP    Time  2    Period  Weeks    Status  New    Target Date  01/20/19      PT SHORT TERM GOAL #2   Title  Pt to report decreased pain in low back to 5/10 with initial standing    Time  2    Period  Weeks    Status  Achieved    Target Date  01/20/19        PT Long Term Goals - 01/10/19 0954      PT LONG TERM GOAL #1   Title  Pt to be independent with final HEP    Time  6    Period  Weeks    Status  New    Target Date  02/17/19      PT LONG TERM GOAL #2   Title  Pt to report decreased pain in low back to 0-2/10 with standing and transfers    Time  6    Period  Weeks    Status  New    Target Date  02/17/19      PT LONG TERM GOAL #3   Title  Pt to demo increased strength of bil hips and core to at least 4+/5 to improve stability and pain    Time  6    Period  Weeks    Status  New  Target Date  02/17/19      PT LONG TERM GOAL #4   Title  Pt to demo lumbar ROM to be [facility] and pain free, to improve ability for transfers, and IADLS.    Time  6    Period  Weeks    Status  New    Target Date  02/17/19             Plan - 01/10/19 7793    Clinical Impression Statement  Pt presents with primary complaint of increased pain in low back. Pt with decreased ROM for lumbar spine, mostly for flexion, with significant muscle tightness bilaterally. She has poor core and hip strenth and lack of effective HEP. Pt with decreased ability for full  functional activities, and work duties, due to pain. PT to benefit from skilled PT to improve deficits and pain.    Examination-Activity Limitations  Transfers;Sit;Stand;Sleep;Squat;Stairs;Lift    Examination-Participation Restrictions  Meal Prep;Cleaning;Community Activity;Driving;Shop;Laundry;Yard Work    Stability/Clinical Decision Making  Stable/Uncomplicated    Clinical Decision Making  Low    Rehab Potential  Good    PT Frequency  2x / week    PT Duration  6 weeks    PT Treatment/Interventions  ADLs/Self Care Home Management;Cryotherapy;Electrical Stimulation;DME Instruction;Ultrasound;Traction;Moist Heat;Iontophoresis 4mg /ml Dexamethasone;Gait training;Stair training;Functional mobility training;Therapeutic activities;Therapeutic exercise;Neuromuscular re-education;Manual techniques;Patient/family education;Passive range of motion;Dry needling;Joint Manipulations;Spinal Manipulations;Taping    Consulted and Agree with Plan of Care  Patient       Patient will benefit from skilled therapeutic intervention in order to improve the following deficits and impairments:  Decreased range of motion, Increased muscle spasms, Decreased activity tolerance, Pain, Impaired flexibility, Improper body mechanics, Decreased mobility, Decreased strength, Hypomobility  Visit Diagnosis: Chronic bilateral low back pain without sciatica     Problem List Patient Active Problem List   Diagnosis Date Noted  . Abnormal mammogram 05/02/2013   Lyndee Hensen, PT, DPT 10:07 AM  01/10/19   [facility] [place], [place], [address] Phone: [phone]   Fax:  [phone]  Name: Rheda Kassab MRN: [id number] Date of Birth: 1970/05/06

## 2019-01-10 NOTE — Patient Instructions (Signed)
Access Code: [email or web address]  URL: [email or web address]  Date: 01/06/2019  Prepared by: Sedalia Muta   Exercises  Supine March - 10 reps - 2 sets - 1x daily Hooklying Clamshell with Resistance - 10 reps - 2 sets - 1x daily Straight Leg Raise - 10 reps - 2 sets - 1x daily Supine Bridge - 10 reps - 2 sets - 1x daily Plank - 3 reps - 30 hold - 1x daily Standard Plank - 3 reps - 30 hold Child's Pose with Sidebending - 3 reps - 30 hold - 2x daily Seated Flexion Stretch - 3 reps - 30 hold - 2x daily Supine Double Knee to Chest - 3 reps - 30 hold - 2x daily Standing Sidebending with Chair Support - 3 reps - 30 hold - 2x daily Supine Posterior Pelvic Tilt - 10 reps - 2 sets - 2x daily

## 2019-01-12 ENCOUNTER — Ambulatory Visit (INDEPENDENT_AMBULATORY_CARE_PROVIDER_SITE_OTHER): Payer: BC Managed Care – PPO | Admitting: Physical Therapy

## 2019-01-12 DIAGNOSIS — G8929 Other chronic pain: Secondary | ICD-10-CM | POA: Diagnosis not present

## 2019-01-12 DIAGNOSIS — M545 Low back pain: Secondary | ICD-10-CM

## 2019-01-14 ENCOUNTER — Encounter: Payer: Self-pay | Admitting: Physical Therapy

## 2019-01-14 NOTE — Therapy (Signed)
[facility] [address] , [place], [address] Phone: [phone]   Fax:  [phone]  Physical Therapy Treatment  Patient Details  Name: [name] MRN: [id number] Date of Birth: 31-Jul-1970 Referring Provider (PT): [name]   Encounter Date: 01/12/2019  PT End of Session - 01/14/19 1351    Visit Number  2    Number of Visits  12    Date for PT Re-Evaluation  02/17/19    Authorization Type  BCBS    PT Start Time  1557    PT Stop Time  1642    PT Time Calculation (min)  45 min    Activity Tolerance  Patient tolerated treatment well    Behavior During Therapy  [facility] for tasks assessed/performed       Past Medical History:  Diagnosis Date  . Arthritis    osteo arthritist  . Heart murmur   . History of chicken pox   . History of recurrent UTIs   . PONV (postoperative nausea and vomiting)   . Screening for HIV (human immunodeficiency virus)     Past Surgical History:  Procedure Laterality Date  . BREAST LUMPECTOMY WITH RADIOACTIVE SEED LOCALIZATION Left 05/13/2013   Procedure: BREAST LUMPECTOMY WITH RADIOACTIVE SEED LOCALIZATION;  Surgeon: Ernestene Mention, MD;  Location: [facility];  Service: General;  Laterality: Left;  . BREAST SURGERY     breast augmentation 08/1998  . DILATION AND CURETTAGE OF UTERUS  02/2009  . ENDOMETRIAL BIOPSY  08/1998  . MANDIBLE FRACTURE SURGERY     1988  . TONSILLECTOMY     1970    There were no vitals filed for this visit.  Subjective Assessment - 01/14/19 1350    Subjective  Pt states back pain wasnt too bad, then she was lifting boxes putting xmas things away, and back"tightned up".    Currently in Pain?  Yes    Pain Score  5     Pain Location  Back    Pain Orientation  Left;Right    Pain Descriptors / Indicators  Aching;Tightness    Pain Type  Chronic pain    Pain Onset  More than a month ago    Pain Frequency  Intermittent                        [facility] Adult PT Treatment/Exercise - 01/14/19 0001      Exercises   Exercises  Lumbar      Lumbar Exercises: Stretches   Single Knee to Chest Stretch  3 reps;30 seconds;Right;Left    Other Lumbar Stretch Exercise  Childs Pose L, R, Center 30 sec x2 each;     Other Lumbar Stretch Exercise  Stading QL at wall 30 sec x2 bil;       Lumbar Exercises: Aerobic   Stationary Bike  L 2 x 7 min;       Lumbar Exercises: Standing   Functional Squats  20 reps    Row  20 reps    Theraband Level (Row)  Level 3 (Green)      Lumbar Exercises: Supine   Clam  20 reps    Clam Limitations  GTB alternating     Bridge  20 reps    Straight Leg Raise  10 reps    Straight Leg Raises Limitations  bil      Lumbar Exercises: Prone   Other Prone Lumbar Exercises  Prone press ups, to elbows  x15;       Manual Therapy   Soft tissue mobilization  DTM and IASTM to bil lumbar paraspinals.                PT Short Term Goals - 01/10/19 0953      PT SHORT TERM GOAL #1   Title  Pt to be independent with initial HEP    Time  2    Period  Weeks    Status  New    Target Date  01/20/19      PT SHORT TERM GOAL #2   Title  Pt to report decreased pain in low back to 5/10 with initial standing    Time  2    Period  Weeks    Status  Achieved    Target Date  01/20/19        PT Long Term Goals - 01/10/19 0954      PT LONG TERM GOAL #1   Title  Pt to be independent with final HEP    Time  6    Period  Weeks    Status  New    Target Date  02/17/19      PT LONG TERM GOAL #2   Title  Pt to report decreased pain in low back to 0-2/10 with standing and transfers    Time  6    Period  Weeks    Status  New    Target Date  02/17/19      PT LONG TERM GOAL #3   Title  Pt to demo increased strength of bil hips and core to at least 4+/5 to improve stability and pain    Time  6    Period  Weeks    Status  New    Target Date  02/17/19      PT LONG TERM GOAL #4    Title  Pt to demo lumbar ROM to be [facility] and pain free, to improve ability for transfers, and IADLS.    Time  6    Period  Weeks    Status  New    Target Date  02/17/19            Plan - 01/14/19 1352    Clinical Impression Statement  Pt with tightness in bil lumbar region, IASTM and DTM done today to improve. Focus on ther ex for decreasing muscle tightness and improving core strength. Pt very challenged with core stability. Improved ability for lumbar extension without pain today, in prone position.    Examination-Activity Limitations  Transfers;Sit;Stand;Sleep;Squat;Stairs;Lift    Examination-Participation Restrictions  Meal Prep;Cleaning;Community Activity;Driving;Shop;Laundry;Yard Work    Stability/Clinical Decision Making  Stable/Uncomplicated    Rehab Potential  Good    PT Frequency  2x / week    PT Duration  6 weeks    PT Treatment/Interventions  ADLs/Self Care Home Management;Cryotherapy;Electrical Stimulation;DME Instruction;Ultrasound;Traction;Moist Heat;Iontophoresis 4mg /ml Dexamethasone;Gait training;Stair training;Functional mobility training;Therapeutic activities;Therapeutic exercise;Neuromuscular re-education;Manual techniques;Patient/family education;Passive range of motion;Dry needling;Joint Manipulations;Spinal Manipulations;Taping    Consulted and Agree with Plan of Care  Patient       Patient will benefit from skilled therapeutic intervention in order to improve the following deficits and impairments:  Decreased range of motion, Increased muscle spasms, Decreased activity tolerance, Pain, Impaired flexibility, Improper body mechanics, Decreased mobility, Decreased strength, Hypomobility  Visit Diagnosis: Chronic bilateral low back pain without sciatica     Problem List Patient Active Problem List   Diagnosis Date Noted  . Abnormal mammogram  05/02/2013    Sedalia Muta, PT, DPT 2:00 PM  01/14/19    [facility] [address] [place], [place], [address] Phone: [phone]   Fax:  [phone]  Name: [name] MRN: [id number] Date of Birth: 1970-11-12

## 2019-01-19 ENCOUNTER — Ambulatory Visit (INDEPENDENT_AMBULATORY_CARE_PROVIDER_SITE_OTHER): Payer: BC Managed Care – PPO | Admitting: Physical Therapy

## 2019-01-19 ENCOUNTER — Encounter: Payer: Self-pay | Admitting: Physical Therapy

## 2019-01-19 ENCOUNTER — Other Ambulatory Visit: Payer: Self-pay

## 2019-01-19 DIAGNOSIS — M545 Low back pain: Secondary | ICD-10-CM

## 2019-01-19 DIAGNOSIS — G8929 Other chronic pain: Secondary | ICD-10-CM

## 2019-01-19 NOTE — Therapy (Addendum)
[facility] [address] [place], [place], [address] Phone: [phone]   Fax:  [phone]  Physical Therapy Treatment  Patient Details  Name: [name] MRN: [id number] Date of Birth: [birth year] Referring Provider (PT): Kris Mouton   Encounter Date: 01/19/2019  PT End of Session - 01/19/19 1300    Visit Number  3    Number of Visits  12    Date for PT Re-Evaluation  02/17/19    Authorization Type  BCBS    PT Start Time  1216    PT Stop Time  1258    PT Time Calculation (min)  42 min    Activity Tolerance  Patient tolerated treatment well    Behavior During Therapy  [facility] for tasks assessed/performed       Past Medical History:  Diagnosis Date  . Arthritis    osteo arthritist  . Heart murmur   . History of chicken pox   . History of recurrent UTIs   . PONV (postoperative nausea and vomiting)   . Screening for HIV (human immunodeficiency virus)     Past Surgical History:  Procedure Laterality Date  . BREAST LUMPECTOMY WITH RADIOACTIVE SEED LOCALIZATION Left 05/13/2013   Procedure: BREAST LUMPECTOMY WITH RADIOACTIVE SEED LOCALIZATION;  Surgeon: Adin Hector, MD;  Location: [place];  Service: General;  Laterality: Left;  . BREAST SURGERY     breast augmentation 08/1998  . DILATION AND CURETTAGE OF UTERUS  02/2009  . ENDOMETRIAL BIOPSY  08/1998  . [place]  . TONSILLECTOMY     1970    There were no vitals filed for this visit.  Subjective Assessment - 01/19/19 1213    Subjective  States back pain variable, ok today, yesterday, very tight    Patient Stated Goals  Decreased pain    Currently in Pain?  Yes    Pain Score  3     Pain Location  Back    Pain Orientation  Right;Left    Pain Descriptors / Indicators  Aching;Tightness    Pain Type  Chronic pain    Pain Onset  More than a month ago    Pain Frequency  Intermittent                       [facility]  Adult PT Treatment/Exercise - 01/19/19 1219      Exercises   Exercises  Lumbar      Lumbar Exercises: Stretches   Single Knee to Chest Stretch  --    Hip Flexor Stretch  60 seconds;2 reps    Hip Flexor Stretch Limitations  thomas test position, with assist     Standing Extension  10 reps    Press Ups  20 reps    Other Lumbar Stretch Exercise  --    Other Lumbar Stretch Exercise  Stading QL at wall 30 sec x2 bil; fwd fold flexion 30 sec x2;       Lumbar Exercises: Aerobic   Stationary Bike  L 2 x 7 min;       Lumbar Exercises: Standing   Functional Squats  20 reps    Functional Squats Limitations  6 lb    Row  --    Theraband Level (Row)  --    Other Standing Lumbar Exercises  HIp Ext 2x10 bil;       Lumbar Exercises: Supine   Clam  --  Clam Limitations  --    Bridge  20 reps    Straight Leg Raise  20 reps    Straight Leg Raises Limitations  bil      Lumbar Exercises: Sidelying   Hip Abduction  20 reps;Both      Lumbar Exercises: Prone   Opposite Arm/Leg Raise  20 reps    Other Prone Lumbar Exercises  Prone hip Ext x10      Manual Therapy   Joint Mobilization  PA hip mobs    Soft tissue mobilization  Manual assist for stretching of hip flexors, bil (thomas test position)     Passive ROM  Manual stretch /prone for hip flexors and quads              PT Education - 01/19/19 1259    Education Details  Reviewed HEP    Person(s) Educated  Patient    Methods  Explanation;Demonstration;Verbal cues    Comprehension  Verbalized understanding;Returned demonstration;Verbal cues required       PT Short Term Goals - 01/10/19 0953      PT SHORT TERM GOAL #1   Title  Pt to be independent with initial HEP    Time  2    Period  Weeks    Status  New    Target Date  01/20/19      PT SHORT TERM GOAL #2   Title  Pt to report decreased pain in low back to 5/10 with initial standing    Time  2    Period  Weeks    Status  Achieved    Target Date  01/20/19         PT Long Term Goals - 01/10/19 0954      PT LONG TERM GOAL #1   Title  Pt to be independent with final HEP    Time  6    Period  Weeks    Status  New    Target Date  02/17/19      PT LONG TERM GOAL #2   Title  Pt to report decreased pain in low back to 0-2/10 with standing and transfers    Time  6    Period  Weeks    Status  New    Target Date  02/17/19      PT LONG TERM GOAL #3   Title  Pt to demo increased strength of bil hips and core to at least 4+/5 to improve stability and pain    Time  6    Period  Weeks    Status  New    Target Date  02/17/19      PT LONG TERM GOAL #4   Title  Pt to demo lumbar ROM to be [facility] and pain free, to improve ability for transfers, and IADLS.    Time  6    Period  Weeks    Status  New    Target Date  02/17/19            Plan - 01/19/19 1301    Clinical Impression Statement  Pt improving with ability for lumbar extension. She has noted tightness in hip flexors, and weakness in L glute vs R with hip extension. Hip flexor stretching added for HEP. Pt with good squat mechanics, but difficulty going from flexion to extension without pain. Plan to progress as tolerated.    Examination-Activity Limitations  Transfers;Sit;Stand;Sleep;Squat;Stairs;Lift    Examination-Participation Restrictions  Meal Prep;Cleaning;Community Activity;Driving;Shop;Laundry;[employer] Work  Stability/Clinical Decision Making  Stable/Uncomplicated    Rehab Potential  Good    PT Frequency  2x / week    PT Duration  6 weeks    PT Treatment/Interventions  ADLs/Self Care Home Management;Cryotherapy;Electrical Stimulation;DME Instruction;Ultrasound;Traction;Moist Heat;Iontophoresis 48m/ml Dexamethasone;Gait training;Stair training;Functional mobility training;Therapeutic activities;Therapeutic exercise;Neuromuscular re-education;Manual techniques;Patient/family education;Passive range of motion;Dry needling;Joint Manipulations;Spinal Manipulations;Taping    Consulted and  Agree with Plan of Care  Patient       Patient will benefit from skilled therapeutic intervention in order to improve the following deficits and impairments:  Decreased range of motion, Increased muscle spasms, Decreased activity tolerance, Pain, Impaired flexibility, Improper body mechanics, Decreased mobility, Decreased strength, Hypomobility  Visit Diagnosis: Chronic bilateral low back pain without sciatica     Problem List Patient Active Problem List   Diagnosis Date Noted  . Abnormal mammogram 05/02/2013   LLyndee Hensen PT, DPT 1:43 PM  01/18/20     [place] [address]Phone: [phone]  Fax:  [phone] Name: JAmandine CovinoMRN: [id number] of Birth: 408-07-1970  PHYSICAL THERAPY DISCHARGE SUMMARY  Visits from Start of Care: 3 Plan: Patient agrees to discharge.  Patient goals were partially met. Patient is being discharged due to not returning since the last visit.  ?????     LLyndee Hensen PT, DPT 1:43 PM  01/18/20

## 2019-01-26 ENCOUNTER — Encounter: Payer: BC Managed Care – PPO | Admitting: Physical Therapy

## 2019-02-20 ENCOUNTER — Ambulatory Visit: Payer: BC Managed Care – PPO

## 2019-03-05 ENCOUNTER — Ambulatory Visit: Payer: BC Managed Care – PPO | Attending: Internal Medicine

## 2019-03-05 DIAGNOSIS — Z23 Encounter for immunization: Secondary | ICD-10-CM | POA: Insufficient documentation

## 2019-03-05 NOTE — Progress Notes (Signed)
   Covid-19 Vaccination Clinic  Name:  Michiah Masse    MRN: [id number] DOB: 1970/01/22  03/05/2019  Ms. Jacome was observed post Covid-19 immunization for 15 minutes without incidence. She was provided with Vaccine Information Sheet and instruction to access the V-Safe system.   Ms. Lope was instructed to call 911 with any severe reactions post vaccine: Marland Kitchen Difficulty breathing  . Swelling of your face and throat  . A fast heartbeat  . A bad rash all over your body  . Dizziness and weakness    Immunizations Administered    Name Date Dose VIS Date Route   Pfizer COVID-19 Vaccine 03/05/2019  4:28 PM 0.3 mL 12/17/2018 Intramuscular   Manufacturer: [employer], [employer]   Lot: [id number]   NDC: [address]-3

## 2019-03-17 DIAGNOSIS — Z6824 Body mass index (BMI) 24.0-24.9, adult: Secondary | ICD-10-CM | POA: Diagnosis not present

## 2019-03-17 DIAGNOSIS — R87619 Unspecified abnormal cytological findings in specimens from cervix uteri: Secondary | ICD-10-CM | POA: Insufficient documentation

## 2019-03-17 DIAGNOSIS — Z01419 Encounter for gynecological examination (general) (routine) without abnormal findings: Secondary | ICD-10-CM | POA: Diagnosis not present

## 2019-03-17 DIAGNOSIS — M1990 Unspecified osteoarthritis, unspecified site: Secondary | ICD-10-CM | POA: Insufficient documentation

## 2019-03-17 DIAGNOSIS — Z1231 Encounter for screening mammogram for malignant neoplasm of breast: Secondary | ICD-10-CM | POA: Diagnosis not present

## 2019-03-26 ENCOUNTER — Ambulatory Visit: Payer: BC Managed Care – PPO | Attending: Internal Medicine

## 2019-03-26 DIAGNOSIS — Z23 Encounter for immunization: Secondary | ICD-10-CM

## 2019-03-26 NOTE — Progress Notes (Signed)
   Covid-19 Vaccination Clinic  Name:  [name]    MRN: [id number] DOB: [birth year]  03/26/2019  [name] was observed post Covid-19 immunization for 15 minutes without incident. She was provided with Vaccine Information Sheet and instruction to access the V-Safe system.   [name] was instructed to call 911 with any severe reactions post vaccine: Marland Kitchen Difficulty breathing  . Swelling of face and throat  . A fast heartbeat  . A bad rash all over body  . Dizziness and weakness   Immunizations Administered    Name Date Dose VIS Date Route   Pfizer COVID-19 Vaccine 03/26/2019  1:03 PM 0.3 mL 12/17/2018 Intramuscular   Manufacturer: [employer], [employer]   Lot: [id number]   NDC: [address]-7

## 2019-04-21 ENCOUNTER — Encounter: Payer: Self-pay | Admitting: Gastroenterology

## 2019-06-17 ENCOUNTER — Ambulatory Visit (AMBULATORY_SURGERY_CENTER): Payer: Self-pay | Admitting: *Deleted

## 2019-06-17 ENCOUNTER — Other Ambulatory Visit: Payer: Self-pay

## 2019-06-17 VITALS — Ht 68.5 in | Wt 164.0 lb

## 2019-06-17 DIAGNOSIS — Z1211 Encounter for screening for malignant neoplasm of colon: Secondary | ICD-10-CM

## 2019-06-17 MED ORDER — SUTAB 1479-225-188 MG PO TABS: 24.0000 | ORAL_TABLET | ORAL | 0 refills | Status: DC

## 2019-06-17 NOTE — Progress Notes (Signed)
No egg or soy allergy known to patient   issues with past sedation with any surgeries  or procedures PONV , no intubation problems  No diet pills per patient No home 02 use per patient  No blood thinners per patient  Pt denies issues with constipation  No A fib or A flutter  EMMI video sent to pt's e mail   03-26-19 comp covid vaccines   Due to the COVID-19 pandemic we are asking patients to follow these guidelines. Please only bring one care partner. Please be aware that your care partner may wait in the car in the parking lot or if they feel like they will be too hot to wait in the car, they may wait in the lobby on the 4th floor. All care partners are required to wear a mask the entire time (we do not have any that we can provide them), they need to practice social distancing, and we will do a Covid check for all patient's and care partners when you arrive. Also we will check their temperature and your temperature. If the care partner waits in their car they need to stay in the parking lot the entire time and we will call them on their cell phone when the patient is ready for discharge so they can bring the car to the front of the building. Also all patient's will need to wear a mask into building.

## 2019-06-23 ENCOUNTER — Encounter: Payer: Self-pay | Admitting: Gastroenterology

## 2019-07-01 ENCOUNTER — Ambulatory Visit (AMBULATORY_SURGERY_CENTER): Payer: BC Managed Care – PPO | Admitting: Gastroenterology

## 2019-07-01 ENCOUNTER — Other Ambulatory Visit: Payer: Self-pay

## 2019-07-01 ENCOUNTER — Encounter: Payer: Self-pay | Admitting: Gastroenterology

## 2019-07-01 VITALS — BP 140/56 | HR 57 | Temp 97.1°F | Resp 15 | Ht 68.5 in | Wt 164.0 lb

## 2019-07-01 DIAGNOSIS — Z1211 Encounter for screening for malignant neoplasm of colon: Secondary | ICD-10-CM

## 2019-07-01 MED ORDER — SODIUM CHLORIDE 0.9 % IV SOLN: 500.0000 mL | Freq: Once | INTRAVENOUS | Status: DC

## 2019-07-01 NOTE — Progress Notes (Signed)
pt tolerated well. VSS. awake and to recovery. Report given to RN.  

## 2019-07-01 NOTE — Progress Notes (Signed)
Pt. Reports no change in her medical or surgical history since her pre-visit 06/17/2019. 

## 2019-07-01 NOTE — Patient Instructions (Signed)
Handout given on Hemorrhoid banding, call the office if you are interested in this procedure.  YOU HAD AN ENDOSCOPIC PROCEDURE TODAY AT THE [facility]:   Refer to the procedure report that was given to you for any specific questions about what was found during the examination.  If the procedure report does not answer your questions, please call your gastroenterologist to clarify.  If you requested that your care partner not be given the details of your procedure findings, then the procedure report has been included in a sealed envelope for you to review at your convenience later.  YOU SHOULD EXPECT: Some feelings of bloating in the abdomen. Passage of more gas than usual.  Walking can help get rid of the air that was put into your GI tract during the procedure and reduce the bloating. If you had a lower endoscopy (such as a colonoscopy or flexible sigmoidoscopy) you may notice spotting of blood in your stool or on the toilet paper. If you underwent a bowel prep for your procedure, you may not have a normal bowel movement for a few days.  Please Note:  You might notice some irritation and congestion in your nose or some drainage.  This is from the oxygen used during your procedure.  There is no need for concern and it should clear up in a day or so.  SYMPTOMS TO REPORT IMMEDIATELY:   Following lower endoscopy (colonoscopy or flexible sigmoidoscopy):  Excessive amounts of blood in the stool  Significant tenderness or worsening of abdominal pains  Swelling of the abdomen that is new, acute  Fever of 100F or higher  For urgent or emergent issues, a gastroenterologist can be reached at any hour by calling [phone]. Do not use MyChart messaging for urgent concerns.    DIET:  We do recommend a small meal at first, but then you may proceed to your regular diet.  Drink plenty of fluids but you should avoid alcoholic beverages for 24 hours.  ACTIVITY:  You should plan to take it  easy for the rest of today and you should NOT DRIVE or use heavy machinery until tomorrow (because of the sedation medicines used during the test).    FOLLOW UP: Our staff will call the number listed on your records 48-72 hours following your procedure to check on you and address any questions or concerns that you may have regarding the information given to you following your procedure. If we do not reach you, we will leave a message.  We will attempt to reach you two times.  During this call, we will ask if you have developed any symptoms of COVID 19. If you develop any symptoms (ie: fever, flu-like symptoms, shortness of breath, cough etc.) before then, please call [phone].  If you test positive for Covid 19 in the 2 weeks post procedure, please call and report this information to [place].    If any biopsies were taken you will be contacted by phone or by letter within the next 1-3 weeks.  Please call us at [phone] if you have not heard about the biopsies in 3 weeks.    SIGNATURES/CONFIDENTIALITY: You and/or your care partner have signed paperwork which will be entered into your electronic medical record.  These signatures attest to the fact that that the information above on your After Visit Summary has been reviewed and is understood.  Full responsibility of the confidentiality of this discharge information lies with you and/or your care-partner.

## 2019-07-01 NOTE — Op Note (Signed)
[facility] Patient Name: [name] Procedure Date: 07/01/2019 9:23 AM MRN: [id number] Endoscopist: Napoleon Form , MD Age: 49 Referring MD:  Date of Birth: 08/27/1970 Gender: Female Account #: [id number] Procedure:                Colonoscopy Indications:              Screening for colorectal malignant neoplasm Medicines:                Monitored Anesthesia Care Procedure:                Pre-Anesthesia Assessment:                           - Prior to the procedure, a History and Physical                            was performed, and patient medications and                            allergies were reviewed. The patient's tolerance of                            previous anesthesia was also reviewed. The risks                            and benefits of the procedure and the sedation                            options and risks were discussed with the patient.                            All questions were answered, and informed consent                            was obtained. Prior Anticoagulants: The patient has                            taken no previous anticoagulant or antiplatelet                            agents. ASA Grade Assessment: II - A patient with                            mild systemic disease. After reviewing the risks                            and benefits, the patient was deemed in                            satisfactory condition to undergo the procedure.                           After obtaining informed consent, the colonoscope  was passed under direct vision. Throughout the                            procedure, the patient's blood pressure, pulse, and                            oxygen saturations were monitored continuously. The                            Colonoscope was introduced through the anus and                            advanced to the the cecum, identified by                            appendiceal orifice  and ileocecal valve. The                            colonoscopy was performed without difficulty. The                            patient tolerated the procedure well. The quality                            of the bowel preparation was excellent. The                            ileocecal valve, appendiceal orifice, and rectum                            were photographed. Scope In: 9:34:18 AM Scope Out: 9:46:56 AM Scope Withdrawal Time: 0 hours 10 minutes 12 seconds  Total Procedure Duration: 0 hours 12 minutes 38 seconds  Findings:                 The perianal and digital rectal examinations were                            normal.                           Non-bleeding internal hemorrhoids were found during                            retroflexion. The hemorrhoids were small.                           The exam was otherwise without abnormality. Complications:            No immediate complications. Estimated Blood Loss:     Estimated blood loss: none. Impression:               - Non-bleeding internal hemorrhoids.                           - The examination was otherwise normal.                           -  No specimens collected. Recommendation:           - Patient has a contact number available for                            emergencies. The signs and symptoms of potential                            delayed complications were discussed with the                            patient. Return to normal activities tomorrow.                            Written discharge instructions were provided to the                            patient.                           - Resume previous diet.                           - Continue present medications.                           - Repeat colonoscopy in 10 years for screening                            purposes. Mauri Pole, MD 07/01/2019 9:51:19 AM This report has been signed electronically.

## 2019-07-05 ENCOUNTER — Telehealth: Payer: Self-pay | Admitting: *Deleted

## 2019-07-05 NOTE — Telephone Encounter (Signed)
1. Have you developed a fever since your procedure? no  2.   Have you had an respiratory symptoms (SOB or cough) since your procedure? no  3.   Have you tested positive for COVID 19 since your procedure no  4.   Have you had any family members/close contacts diagnosed with the COVID 19 since your procedure?  no   If yes to any of these questions please route to Laverna Peace, RN and Charlett Lango, RN   Follow up Call-  Call back number 07/01/2019  Post procedure Call Back phone  # [phone]  Permission to leave phone message Yes  Some recent data might be hidden     Patient questions:  Do you have a fever, pain , or abdominal swelling? No. Pain Score  0 *  Have you tolerated food without any problems? Yes.    Have you been able to return to your normal activities? Yes.    Do you have any questions about your discharge instructions: Diet   No. Medications  No. Follow up visit  No.  Do you have questions or concerns about your Care? No.  Actions: * If pain score is 4 or above: No action needed, pain <4.

## 2019-10-27 DIAGNOSIS — Z20822 Contact with and (suspected) exposure to covid-19: Secondary | ICD-10-CM | POA: Diagnosis not present

## 2019-10-27 DIAGNOSIS — J029 Acute pharyngitis, unspecified: Secondary | ICD-10-CM | POA: Diagnosis not present

## 2019-10-27 DIAGNOSIS — B001 Herpesviral vesicular dermatitis: Secondary | ICD-10-CM | POA: Diagnosis not present

## 2020-01-08 DIAGNOSIS — J069 Acute upper respiratory infection, unspecified: Secondary | ICD-10-CM | POA: Diagnosis not present

## 2020-01-08 DIAGNOSIS — Z711 Person with feared health complaint in whom no diagnosis is made: Secondary | ICD-10-CM | POA: Diagnosis not present

## 2020-01-10 ENCOUNTER — Telehealth (INDEPENDENT_AMBULATORY_CARE_PROVIDER_SITE_OTHER): Payer: BC Managed Care – PPO | Admitting: Family Medicine

## 2020-01-10 DIAGNOSIS — U071 COVID-19: Secondary | ICD-10-CM | POA: Diagnosis not present

## 2020-01-10 MED ORDER — BENZONATATE 100 MG PO CAPS: 100.0000 mg | ORAL_CAPSULE | Freq: Three times a day (TID) | ORAL | 0 refills | Status: DC | PRN

## 2020-01-10 NOTE — Progress Notes (Addendum)
Virtual Visit via Video Note  I connected with [name]  on 01/10/20 at  6:20 PM EST by a video enabled telemedicine application and verified that I am speaking with the correct person using two identifiers.  Video visit failed and completed visit and audio.  Location patient: home, [place] Location provider:work or home office Persons participating in the virtual visit: patient, provider  I discussed the limitations of evaluation and management by telemedicine and the availability of in person appointments. The patient expressed understanding and agreed to proceed.   HPI:  Acute telemedicine visit for : -Onset: started ~ 01/07/2020 -Symptoms include: achy, headache, nasal congestion, fever up to high of 101 initially, loose stools -had positive covid test  - results just came back -Denies:cp, SOB, inability to eat/drink/get out of bed, NVD -Has tried: musinex, cold and sinus medication -Pertinent past medical history:none per patient -Pertinent medication allergies:sulfa -COVID-19 vaccine status: fully vaccinated + booster last week  ROS: See pertinent positives and negatives per HPI.  Past Medical History:  Diagnosis Date  . Allergy   . Arthritis    osteo arthritist  . GERD (gastroesophageal reflux disease)    OCC - uses Pepcid PRN   . Heart murmur   . History of chicken pox   . History of recurrent UTIs   . PONV (postoperative nausea and vomiting)   . Screening for HIV (human immunodeficiency virus)     Past Surgical History:  Procedure Laterality Date  . BREAST LUMPECTOMY WITH RADIOACTIVE SEED LOCALIZATION Left 05/13/2013   Procedure: BREAST LUMPECTOMY WITH RADIOACTIVE SEED LOCALIZATION;  Surgeon: Ernestene Mention, MD;  Location: [facility];  Service: General;  Laterality: Left;  . BREAST SURGERY     breast augmentation 08/1998  . DILATION AND CURETTAGE OF UTERUS  02/2009  . ENDOMETRIAL BIOPSY  08/1998  . MANDIBLE FRACTURE SURGERY     1988  . TONSILLECTOMY      1970     Current Outpatient Medications:  .  benzonatate (TESSALON PERLES) 100 MG capsule, Take 1 capsule (100 mg total) by mouth 3 (three) times daily as needed., Disp: 20 capsule, Rfl: 0 .  baclofen (LIORESAL) 20 MG tablet, Take 1 tablet (20 mg total) by mouth 3 (three) times daily. (Patient not taking: Reported on 06/17/2019), Disp: 90 each, Rfl: 1 .  diclofenac sodium (VOLTAREN) 1 % GEL, Apply 4 g topically 4 (four) times daily. (Patient not taking: Reported on 07/01/2019), Disp: 100 g, Rfl: 1 .  famotidine (PEPCID) 40 MG tablet, Take 0.5 tablets (20 mg total) by mouth daily. (Patient not taking: Reported on 07/01/2019), Disp: 90 tablet, Rfl: 0 .  fexofenadine (ALLEGRA) 180 MG tablet, Take 180 mg by mouth as needed.  (Patient not taking: Reported on 07/01/2019), Disp: , Rfl:  .  FLUoxetine (PROZAC) 20 MG capsule, TAKE 1 CAPSULE BY MOUTH EVERY DAY IN THE MORNING, Disp: , Rfl:  .  Nutritional Supplements (HIGH-PROTEIN NUTRITIONAL SHAKE PO), Take by mouth., Disp: , Rfl:  .  triamcinolone cream (KENALOG) 0.1 %, APPLY TO AFFECTED AREA TWICE A DAY, Disp: 80 g, Rfl: 0  EXAM:  VITALS per patient if applicable:  GENERAL: No audible sounds of distress  PSYCH/NEURO: pleasant and cooperative,  speech and thought processing grossly intact  ASSESSMENT AND PLAN:  Discussed the following assessment and plan:  COVID-19  -we discussed possible serious and likely etiologies, options for evaluation and workup, limitations of telemedicine visit vs in person visit, treatment, treatment risks and precautions. Pt prefers  to treat via telemedicine empirically rather than in person at this moment.  Discussed treatment options, potential complications, isolation and precautions.  She opted for Tessalon for cough, nasal saline and other symptomatic care measures summarized in patient instructions. Work/School slipped offered: provided in patient instructions   Advised to seek prompt in person care if worsening,  new symptoms arise, or if is not improving with treatment. Discussed options for inperson care if PCP office not available. Did let this patient know that I only do telemedicine on Tuesdays and Thursdays for [place]. Advised to schedule follow up visit with PCP or [facility] if any further questions or concerns to avoid delays in care.   I discussed the assessment and treatment plan with the patient.  Spent 17 minutes on this encounter on this date for review of chart, evaluation of the pains counseling and discussion on testing and treatment.  The patient was provided an opportunity to ask questions and all were answered. The patient agreed with the plan and demonstrated an understanding of the instructions.     Terressa Koyanagi, DO

## 2020-01-10 NOTE — Patient Instructions (Signed)
   ---------------------------------------------------------------------------------------------------------------------------      WORK SLIP:  Patient [name],  December 17, 1970, was seen for a medical visit today, 01/10/20 . Please excuse from work according to the [facility] guidelines for a COVID like illness. We advise 10 days minimum from the onset of symptoms (01/07/2020) PLUS 1 day of no fever and improved symptoms. Will defer to employer for a sooner return to work if greater than 5 days from a positive test, symptoms have resolved, negative test and patient can wear a good mask at all times for an additional 5 days.    Sincerely: E-signature: Dr. Kriste Basque, DO [facility] [place] Ph: [phone]   ------------------------------------------------------------------------------------------------------------------------------   HOME CARE TIPS:   -I sent the medication(s) we discussed to your pharmacy: Meds ordered this encounter  Medications  . benzonatate (TESSALON PERLES) 100 MG capsule    Sig: Take 1 capsule (100 mg total) by mouth 3 (three) times daily as needed.    Dispense:  20 capsule    Refill:  0     -can use tylenol or aleve if needed for fevers, aches and pains per instructions  -can use nasal saline a few times per day if nasal congestion, sometime a short course of Afrin nasal spray for 3 days can help as well  -stay hydrated, drink plenty of fluids and eat small healthy meals - avoid dairy  -can take 1000 IU Vit D3 and Vit C lozenges daily per instructions  -follow up with your doctor in 2-3 days unless improving and feeling better  It was nice to meet you today, and I really hope you are feeling better soon. I help [facility] out with telemedicine visits on Tuesdays and Thursdays and am available for visits on those days. If you have any concerns or questions following this visit please schedule a follow up visit with your Primary Care doctor or  seek care at a local urgent care clinic to avoid delays in care.    Seek in person care promptly if your symptoms worsen, new concerns arise or you are not improving with treatment. Call 911 and/or seek emergency care if you symptoms are severe or life threatening.

## 2020-02-16 ENCOUNTER — Encounter: Payer: Self-pay | Admitting: Family Medicine

## 2020-02-16 ENCOUNTER — Telehealth (INDEPENDENT_AMBULATORY_CARE_PROVIDER_SITE_OTHER): Payer: BC Managed Care – PPO | Admitting: Family Medicine

## 2020-02-16 VITALS — Ht 68.5 in | Wt 164.0 lb

## 2020-02-16 DIAGNOSIS — U071 COVID-19: Secondary | ICD-10-CM

## 2020-02-16 MED ORDER — AMOXICILLIN-POT CLAVULANATE 875-125 MG PO TABS: 1.0000 | ORAL_TABLET | Freq: Two times a day (BID) | ORAL | 0 refills | Status: DC

## 2020-02-16 NOTE — Progress Notes (Signed)
Patient: [name] MRN: [id number] DOB: 01-28-1970 PCP: Orland Mustard, MD     I connected with [name] on 02/16/20 at 2:39pm by a video enabled telemedicine application and verified that I am speaking with the correct person using two identifiers.  Location patient: Home Location provider: [facility], Office Persons participating in this virtual visit: Dr. Artis Flock and [name]  I discussed the limitations of evaluation and management by telemedicine and the availability of in person appointments. The patient expressed understanding and agreed to proceed.   Subjective:  Chief Complaint  Patient presents with  . Covid Positive    Pt reports being positive for Covid the first of the year. She complains of lingering symptoms.   . Cough    Dry accept for first thing in the morning.   . Nasal Congestion    HPI: The patient is a 50 y.o. female who presents today for Post Covid symptoms. She had symptoms of covid January 07, 2020. She states she went and got a test on 1/2 and was positive on 01/10/20. She felt like she had a really bad sinus infection. She had fever to 101.4 for first day, fatigue and mild headaches. She states she got better, but still has a lingering dry cough during the day and it's wet in the AM. She continues to have a lot of sinus congestion/pressure and running nose. She has taken mucinex DM and was taking sudafed, but stopped this. She has not had any fever since beginning. No pain or pressure in sinus area and no pain in her teeth. She has been using flonase.    Review of Systems  Constitutional: Negative for fever.  HENT: Positive for congestion.   Respiratory: Positive for cough. Negative for chest tightness and shortness of breath.   Neurological: Negative for headaches.    Allergies Patient is allergic to petrolatum-zinc oxide and sulfa antibiotics.  Past Medical History Patient  has a past medical history of Allergy, Arthritis, GERD  (gastroesophageal reflux disease), Heart murmur, History of chicken pox, History of recurrent UTIs, PONV (postoperative nausea and vomiting), and Screening for HIV (human immunodeficiency virus).  Surgical History Patient  has a past surgical history that includes Breast surgery; Dilation and curettage of uterus (02/2009); Endometrial biopsy (08/1998); Tonsillectomy; Mandible fracture surgery; and Breast lumpectomy with radioactive seed localization (Left, 05/13/2013).  Family History Pateint's family history includes Arthritis in her father and mother; Birth defects in her paternal grandfather; Breast cancer in her maternal aunt, mother, and paternal aunt; COPD in her father; Cancer in her father, maternal aunt, maternal grandfather, and paternal aunt; Colon polyps in her father and sister; Depression in her father and mother; Heart attack in her maternal grandfather; Heart disease in her maternal grandfather; Hyperlipidemia in her maternal grandfather; Hypertension in her father; Miscarriages / [place] in her sister and sister; Prostate cancer in her father.  Social History Patient  reports that she quit smoking about 16 years ago. She has never used smokeless tobacco. She reports current alcohol use. She reports that she does not use drugs.    Objective: Vitals:   02/16/20 1423  Weight: 164 lb 0.4 oz (74.4 kg)  Height: 5' 8.5" (1.74 m)    Body mass index is 24.58 kg/m.  Physical Exam Vitals reviewed.  Constitutional:      Appearance: Normal appearance. She is normal weight.  HENT:     Head: Normocephalic and atraumatic.     Comments: No ttp over sinuses when she presses  Pulmonary:     Effort: Pulmonary effort is normal.  Neurological:     General: No focal deficit present.     Mental Status: She is alert and oriented to person, place, and time.  Psychiatric:        Mood and Affect: Mood normal.        Behavior: Behavior normal.        Assessment/plan: 1.  COVID-19 -resolved, but having lingering symptoms and ? If sinus infection from covid. Has been >4 weeks of sinus congestion and pressure.  -course of augmentin and discussed I would continue conservative therapy for cough. Have seen this linger for quite some time, almost like bronchitis.  -encouraged food with augmentin and if any diarrhea to let me know.  -continue flonase, honey, robitussin DM and can take her tessalon pearls prn for cough. -I need to know if worsening symptoms, change, fever or prolonged cough past 6-8 weeks.     Return if symptoms worsen or fail to improve.     Orland Mustard, MD [facility]  02/16/2020

## 2020-02-20 ENCOUNTER — Telehealth: Payer: Self-pay

## 2020-02-20 NOTE — Telephone Encounter (Signed)
Patient states she was prescribed Amoxicillin Thursday, started having diarrhea no stomach cramps. She forgot to mention to [facility] that she is on a optiva diet and has not had a period since the beginning of the month.

## 2020-02-20 NOTE — Telephone Encounter (Signed)
Let her know if diarrhea is bad we need to stop and I can send in different antibiotic.  Thanks!  aw

## 2020-02-20 NOTE — Telephone Encounter (Signed)
FYI

## 2020-02-20 NOTE — Telephone Encounter (Signed)
Lvm for pt to call the office back. 

## 2020-02-22 NOTE — Telephone Encounter (Signed)
Pt d/c'd antibiotic, and says that diarrhea has stopped.

## 2020-02-27 ENCOUNTER — Encounter: Payer: Self-pay | Admitting: Family Medicine

## 2020-02-27 ENCOUNTER — Ambulatory Visit: Payer: BC Managed Care – PPO | Admitting: Family Medicine

## 2020-02-27 ENCOUNTER — Other Ambulatory Visit: Payer: Self-pay

## 2020-02-27 VITALS — BP 102/70 | HR 75 | Temp 98.1°F | Ht 68.5 in | Wt 154.6 lb

## 2020-02-27 DIAGNOSIS — R0982 Postnasal drip: Secondary | ICD-10-CM | POA: Diagnosis not present

## 2020-02-27 DIAGNOSIS — L989 Disorder of the skin and subcutaneous tissue, unspecified: Secondary | ICD-10-CM

## 2020-02-27 NOTE — Patient Instructions (Signed)
-  likely prolonged from covid. No signs of infection on exam. I would start flonase nightly and a zyrtec daily. If mucous changes or increased pressure/pain or worsening cough let's start antibiotics, but clinically no indicated   -black cohosh is fine.   -booster could have messed with period vs. Truly going through peri menopause  -referral done for [facility] dermatology

## 2020-02-27 NOTE — Progress Notes (Signed)
Patient: [name] MRN: [id number] DOB: 11-Mar-1970 PCP: Orland Mustard, MD     Subjective:  Chief Complaint  Patient presents with  . Cough  . Nasal Congestion    HPI: The patient is a 50 y.o. female who presents today for a lingering cough and nasal congestion since having Covid. She complains of a mild headache in the morning and fluid in her right ear. She mentions soreness in both arms at injection sites. She would also like to discuss missed cycles since having Covid. She had covid back in January 01/07/2020. Tested on 1/2 and it was positive. She still has cough and nasal congestion. I gave her augmentin and she had diarrhea so stopped this. No fever/chills, no shortness of breath. Mild nasal congestion, no sinus pain or pressure. She hasn't been taking her flonase like she should. No wheezing.   Review of Systems  Constitutional: Negative for chills, fatigue and fever.  HENT: Positive for congestion. Negative for sinus pressure, sinus pain, sneezing and sore throat.   Respiratory: Positive for cough. Negative for shortness of breath and wheezing.     Allergies Patient is allergic to petrolatum-zinc oxide and sulfa antibiotics.  Past Medical History Patient  has a past medical history of Allergy, Arthritis, GERD (gastroesophageal reflux disease), Heart murmur, History of chicken pox, History of recurrent UTIs, PONV (postoperative nausea and vomiting), and Screening for HIV (human immunodeficiency virus).  Surgical History Patient  has a past surgical history that includes Breast surgery; Dilation and curettage of uterus (02/2009); Endometrial biopsy (08/1998); Tonsillectomy; Mandible fracture surgery; and Breast lumpectomy with radioactive seed localization (Left, 05/13/2013).  Family History Pateint's family history includes Arthritis in her father and mother; Birth defects in her paternal grandfather; Breast cancer in her maternal aunt, mother, and paternal aunt; COPD in her  father; Cancer in her father, maternal aunt, maternal grandfather, and paternal aunt; Colon polyps in her father and sister; Depression in her father and mother; Heart attack in her maternal grandfather; Heart disease in her maternal grandfather; Hyperlipidemia in her maternal grandfather; Hypertension in her father; Miscarriages / [place] in her sister and sister; Prostate cancer in her father.  Social History Patient  reports that she quit smoking about 16 years ago. She has never used smokeless tobacco. She reports current alcohol use. She reports that she does not use drugs.    Objective: Vitals:   02/27/20 1115  BP: 102/70  Pulse: 75  Temp: 98.1 F (36.7 C)  TempSrc: Temporal  SpO2: 98%  Weight: 154 lb 9.6 oz (70.1 kg)  Height: 5' 8.5" (1.74 m)    Body mass index is 23.16 kg/m.  Physical Exam Vitals reviewed.  Constitutional:      Appearance: Normal appearance. She is well-developed, normal weight and well-nourished.  HENT:     Head: Normocephalic and atraumatic.     Comments: No ttp over sinuses     Right Ear: Tympanic membrane, ear canal and external ear normal.     Left Ear: Tympanic membrane, ear canal and external ear normal.     Nose: Nose normal. No congestion.     Mouth/Throat:     Mouth: Oropharynx is clear and moist. Mucous membranes are moist.     Comments: +cobblestoning on posterior pharynx  Eyes:     Extraocular Movements: EOM normal.     Conjunctiva/sclera: Conjunctivae normal.     Pupils: Pupils are equal, round, and reactive to light.  Neck:     Thyroid: No thyromegaly.  Cardiovascular:  Rate and Rhythm: Normal rate and regular rhythm.     Pulses: Normal pulses and intact distal pulses.     Heart sounds: Normal heart sounds. No murmur heard.   Pulmonary:     Effort: Pulmonary effort is normal.     Breath sounds: Normal breath sounds.  Abdominal:     General: Abdomen is flat. Bowel sounds are normal. There is no distension.      Palpations: Abdomen is soft.     Tenderness: There is no abdominal tenderness.  Musculoskeletal:     Cervical back: Normal range of motion and neck supple.  Lymphadenopathy:     Cervical: No cervical adenopathy.  Skin:    General: Skin is warm and dry.     Findings: No rash.  Neurological:     General: No focal deficit present.     Mental Status: She is alert and oriented to person, place, and time.     Cranial Nerves: No cranial nerve deficit.     Coordination: Coordination normal.     Deep Tendon Reflexes: Reflexes normal.  Psychiatric:        Mood and Affect: Mood and affect and mood normal.        Behavior: Behavior normal.        Assessment/plan: 1. Post-nasal drip Secondary to covid. Still has some congestion, drainage and cough. Discussed clinical exam normal and would treat this conservatively and with time. Nothing to indicate abx. Start flonase up at night. If she has worsening symptoms, change in mucous or viscosity or production she is to let me know.   2. Skin lesion Would like skin exam.  - Ambulatory referral to Dermatology   This visit occurred during the SARS-CoV-2 public health emergency.  Safety protocols were in place, including screening questions prior to the visit, additional usage of staff PPE, and extensive cleaning of exam room while observing appropriate contact time as indicated for disinfecting solutions.     Return if symptoms worsen or fail to improve.    Orland Mustard, MD [facility]   02/27/2020

## 2020-05-10 DIAGNOSIS — Z01419 Encounter for gynecological examination (general) (routine) without abnormal findings: Secondary | ICD-10-CM | POA: Diagnosis not present

## 2020-05-10 DIAGNOSIS — Z6822 Body mass index (BMI) 22.0-22.9, adult: Secondary | ICD-10-CM | POA: Diagnosis not present

## 2020-05-10 DIAGNOSIS — Z1231 Encounter for screening mammogram for malignant neoplasm of breast: Secondary | ICD-10-CM | POA: Diagnosis not present

## 2020-05-14 ENCOUNTER — Other Ambulatory Visit: Payer: Self-pay | Admitting: Obstetrics & Gynecology

## 2020-05-14 DIAGNOSIS — R928 Other abnormal and inconclusive findings on diagnostic imaging of breast: Secondary | ICD-10-CM

## 2020-05-19 ENCOUNTER — Ambulatory Visit
Admission: RE | Admit: 2020-05-19 | Discharge: 2020-05-19 | Disposition: A | Discharge status: Home / Self Care | Payer: BC Managed Care – PPO | Source: Ambulatory Visit | Attending: Obstetrics & Gynecology | Admitting: Obstetrics & Gynecology

## 2020-05-19 ENCOUNTER — Other Ambulatory Visit: Payer: Self-pay | Admitting: Obstetrics & Gynecology

## 2020-05-19 ENCOUNTER — Other Ambulatory Visit: Payer: Self-pay

## 2020-05-19 DIAGNOSIS — R928 Other abnormal and inconclusive findings on diagnostic imaging of breast: Secondary | ICD-10-CM

## 2020-05-29 DIAGNOSIS — L814 Other melanin hyperpigmentation: Secondary | ICD-10-CM | POA: Diagnosis not present

## 2020-05-29 DIAGNOSIS — L570 Actinic keratosis: Secondary | ICD-10-CM | POA: Diagnosis not present

## 2020-05-29 DIAGNOSIS — I788 Other diseases of capillaries: Secondary | ICD-10-CM | POA: Diagnosis not present

## 2020-05-29 DIAGNOSIS — D2261 Melanocytic nevi of right upper limb, including shoulder: Secondary | ICD-10-CM | POA: Diagnosis not present

## 2020-07-17 DIAGNOSIS — N952 Postmenopausal atrophic vaginitis: Secondary | ICD-10-CM | POA: Diagnosis not present

## 2020-07-17 DIAGNOSIS — N941 Unspecified dyspareunia: Secondary | ICD-10-CM | POA: Diagnosis not present

## 2020-07-25 ENCOUNTER — Telehealth: Payer: Self-pay

## 2020-07-25 NOTE — Telephone Encounter (Signed)
Talked to pt that Dr Artis Flock has left the office. She stated that she will think about keeping her care here and call back later

## 2020-10-20 DIAGNOSIS — J019 Acute sinusitis, unspecified: Secondary | ICD-10-CM | POA: Diagnosis not present

## 2020-10-20 DIAGNOSIS — B9689 Other specified bacterial agents as the cause of diseases classified elsewhere: Secondary | ICD-10-CM | POA: Diagnosis not present

## 2020-11-21 ENCOUNTER — Other Ambulatory Visit: Payer: BC Managed Care – PPO

## 2020-11-26 DIAGNOSIS — D1801 Hemangioma of skin and subcutaneous tissue: Secondary | ICD-10-CM | POA: Diagnosis not present

## 2020-11-26 DIAGNOSIS — L570 Actinic keratosis: Secondary | ICD-10-CM | POA: Diagnosis not present

## 2020-11-26 DIAGNOSIS — L814 Other melanin hyperpigmentation: Secondary | ICD-10-CM | POA: Diagnosis not present

## 2020-11-26 DIAGNOSIS — D2372 Other benign neoplasm of skin of left lower limb, including hip: Secondary | ICD-10-CM | POA: Diagnosis not present

## 2020-12-19 IMAGING — DX DG CERVICAL SPINE COMPLETE 4+V
6 series · 6 of 6 positions shown · non-contrast
Comparison: None.

CLINICAL DATA: Tingling in the left arm

EXAM:
CERVICAL SPINE - COMPLETE 4+ VIEW

[cervical spine lat (1 of 2)]
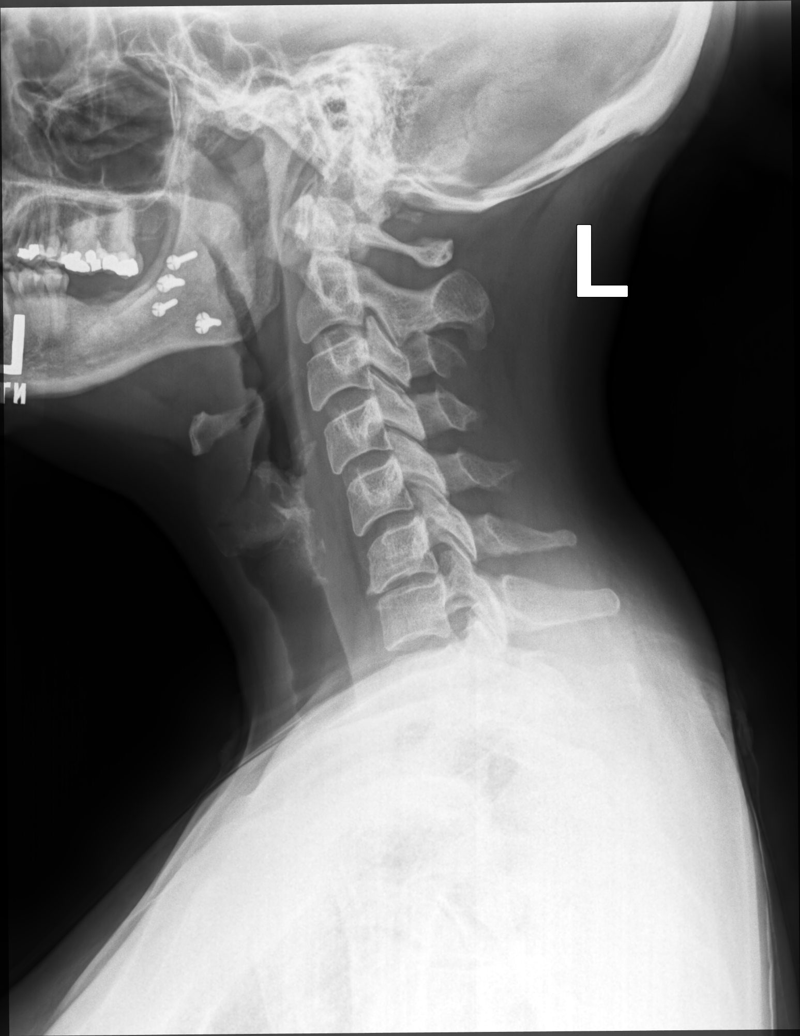

[cervical spine oblique (1 of 2)]
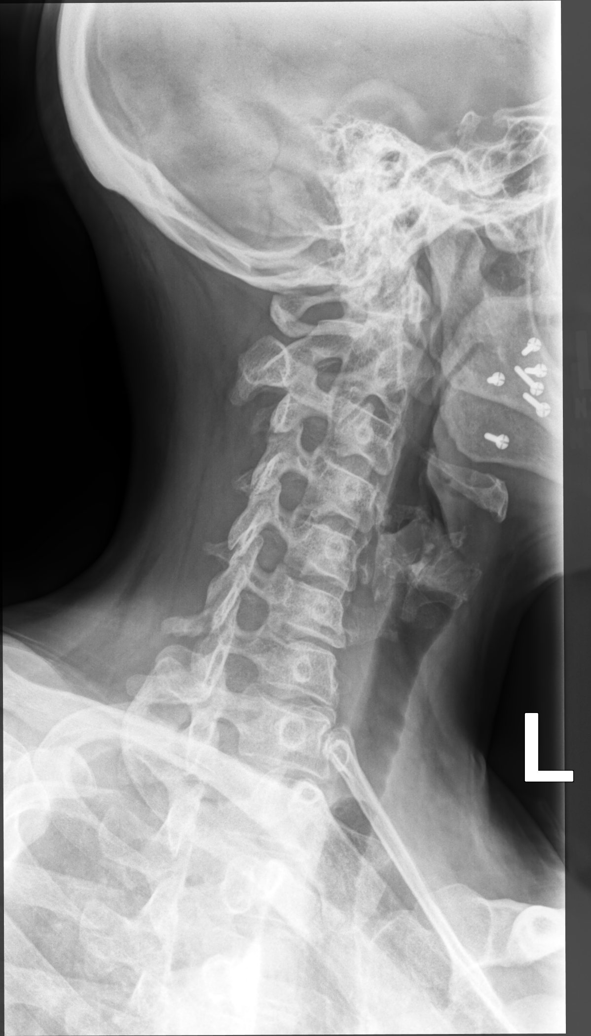

[cervical spine oblique (2 of 2)]
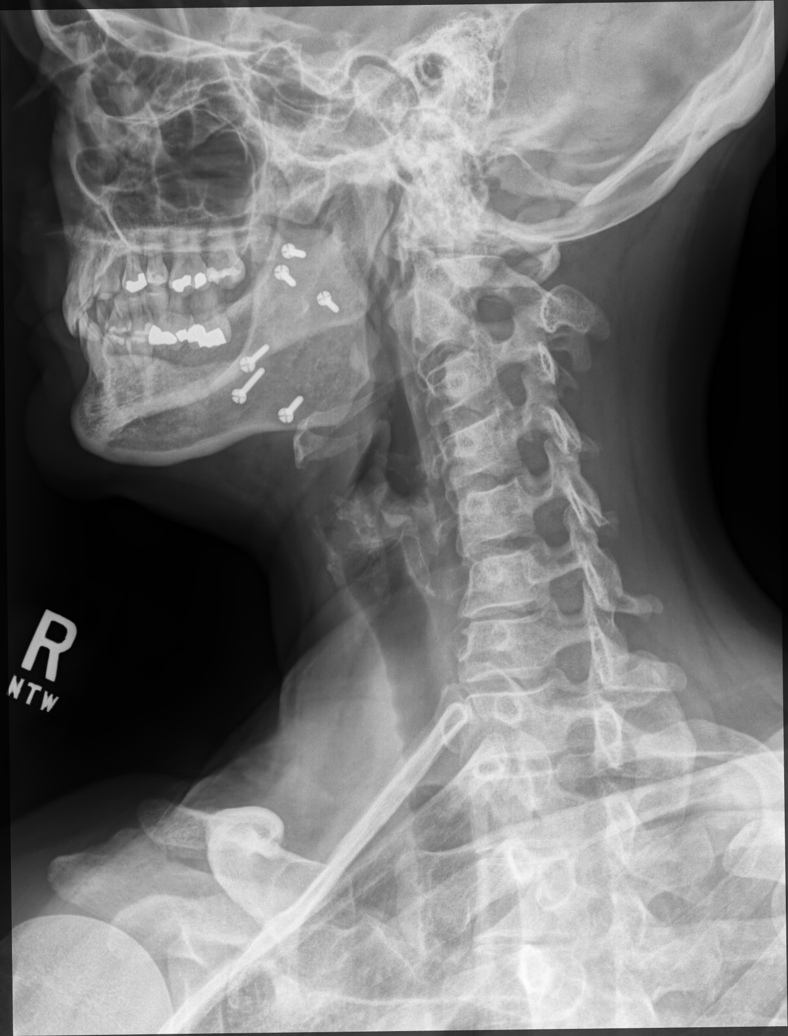

[cervical spine ap (1 of 2)]
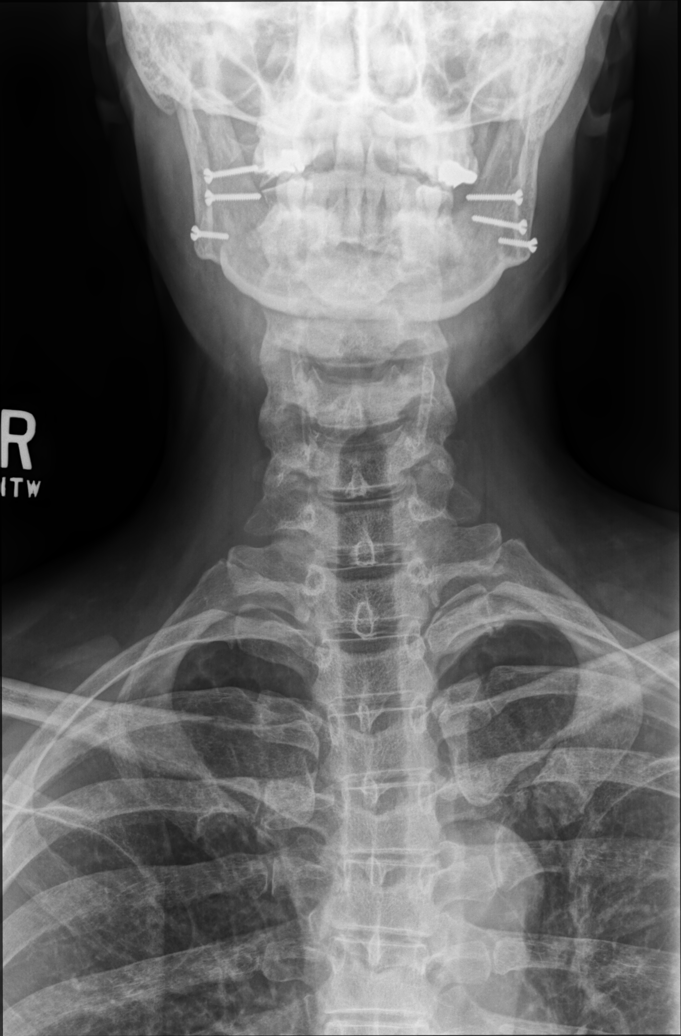

[cervical spine ap (2 of 2)]
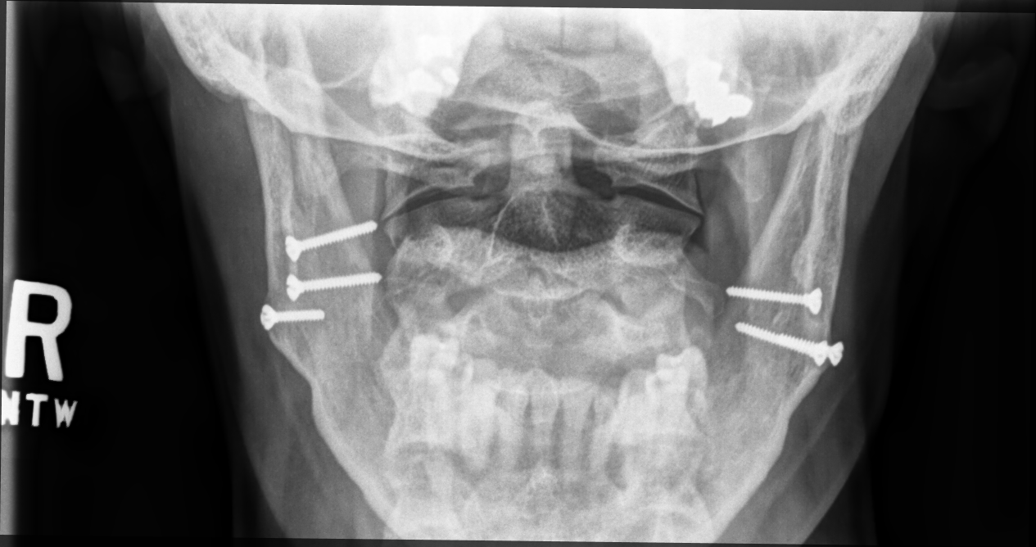

[cervical spine lat (2 of 2)]
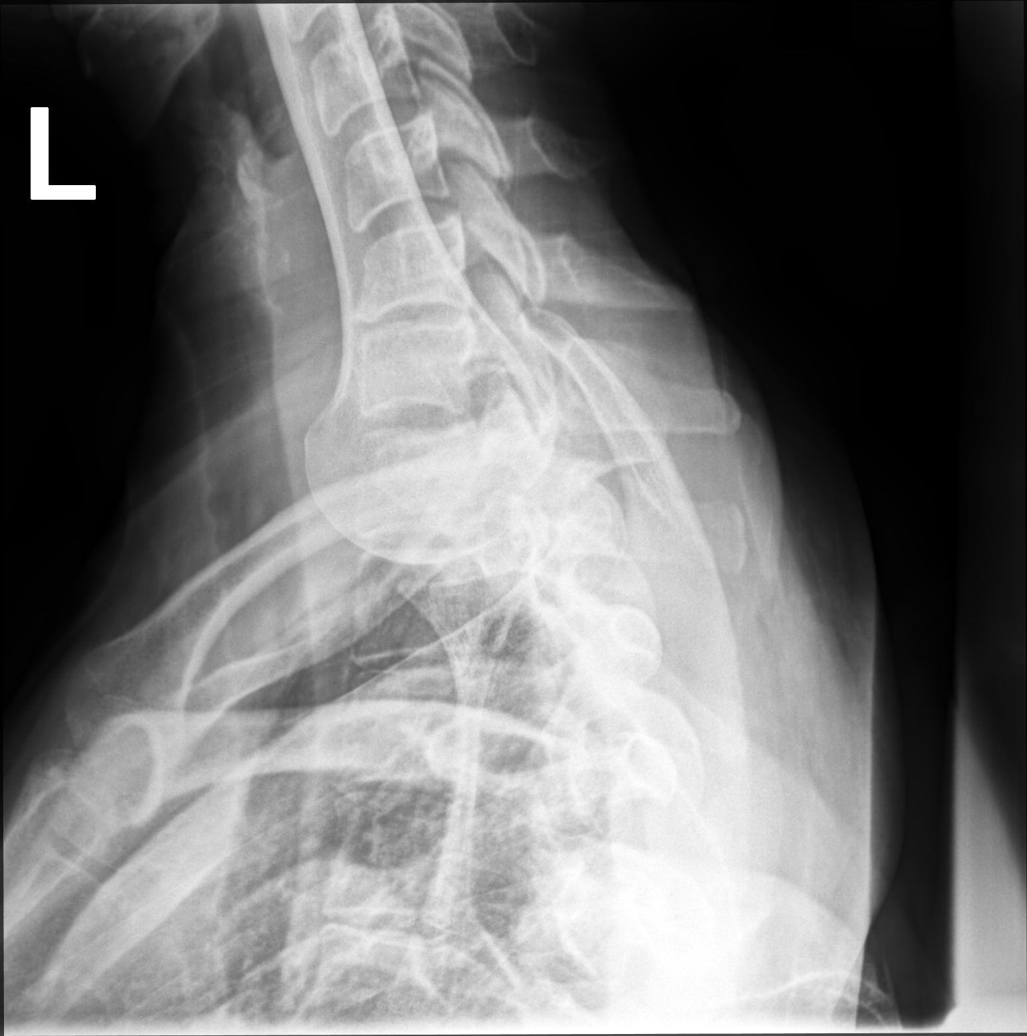

[6 of 6 positions shown; findings below may reference images not displayed]

FINDINGS: Straightening of the cervical spine. Vertebral body heights appear
normal. Normal prevertebral soft tissue thickness. Moderate disc
space narrowing C6-C7. Foramen appear grossly patent. Dens and
lateral masses are within normal limits. Fixating screws within the
bilateral mandible
IMPRESSION: 1. Straightening of the cervical spine with degenerative changes at
C6-C7.

## 2021-02-27 ENCOUNTER — Encounter: Payer: Self-pay | Admitting: Physician Assistant

## 2021-02-27 ENCOUNTER — Ambulatory Visit: Payer: BC Managed Care – PPO | Admitting: Physician Assistant

## 2021-02-27 ENCOUNTER — Other Ambulatory Visit: Payer: Self-pay

## 2021-02-27 VITALS — BP 110/80 | HR 76 | Temp 98.2°F | Ht 68.5 in | Wt 154.4 lb

## 2021-02-27 DIAGNOSIS — R202 Paresthesia of skin: Secondary | ICD-10-CM | POA: Diagnosis not present

## 2021-02-27 DIAGNOSIS — N951 Menopausal and female climacteric states: Secondary | ICD-10-CM

## 2021-02-27 DIAGNOSIS — Z8249 Family history of ischemic heart disease and other diseases of the circulatory system: Secondary | ICD-10-CM | POA: Diagnosis not present

## 2021-02-27 DIAGNOSIS — R079 Chest pain, unspecified: Secondary | ICD-10-CM

## 2021-02-27 MED ORDER — GABAPENTIN 100 MG PO CAPS: ORAL_CAPSULE | ORAL | 1 refills | Status: DC

## 2021-02-27 NOTE — Progress Notes (Signed)
[name] is a 51 y.o. female here for elevated BP reading.  History of Present Illness:   Chief Complaint  Patient presents with   c/o elevated blood pressure    Pt yesterday felt like her blood pressure was up took at work was 180/90 something, later in the evening took at home and was 142/86. Denies headache, but has not been sleeping.    Chest Pain    Since yesterday     HPI  Chest Pain [name] presents with c/o elevated BP reading and chest pain that has been onset since yesterday. 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 Upon waiting a couple of minutes, she re-took it and found her systolic number had decreased, but her diastolic number remained around 90. As the day continued, she ate her regular lunch and drank plenty of water which made her feel better.  Although she felt better she decided to re-check her BP at home that evening which read as 142/86. In regards to her chest pain she described this to be a tightening sensation. 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   Patient denies SOB, blurred vision, dizziness, unusual headaches, lower leg swelling. Denies excessive caffeine intake, stimulant usage, excessive alcohol intake, or increase in salt consumption.  BP Readings from Last 3 Encounters:  02/27/21 110/80  02/27/20 102/70  07/01/19 (!) 140/56   She does have family history of heart disease/stroke in [place].   Vasomotor Symptoms In addition, she has been experiencing increased irritability and lack of sleep for the past year. States that when she tries to sleep, she is unable to due to racing thoughts that she can't seem to stop. Pt has been previously on prozac for perimenopause but has since stopped this medication due to it causing weight gain.  In an effort to get more sleep she has tried taking melatonin 5 mg but this has provided no relief. Denies SI/HI.   Paresthesia  [name] does admit she has also been experiencing what feels like a numbing/tingling sensation from bilateral calf region to her feet. She is unsure as to what could be causing this and is unsure how to treat it. Denies pain, sensation radiation, or trouble walking.   Past Medical History:  Diagnosis Date   Allergy    Arthritis    osteo arthritist   GERD (gastroesophageal reflux disease)    OCC - uses Pepcid PRN    Heart murmur    History of chicken pox    History of recurrent UTIs    PONV (postoperative nausea and vomiting)    Screening for HIV (human immunodeficiency virus)      Social History   Tobacco Use   Smoking status: Former    Types: Cigarettes    Quit date: 05/03/2003    Years since quitting: 17.8   Smokeless tobacco: Never  Vaping Use   Vaping Use: Never used  Substance Use Topics   Alcohol use: Yes    Comment: social   Drug use: No    Past Surgical History:  Procedure Laterality Date   BREAST LUMPECTOMY WITH RADIOACTIVE SEED LOCALIZATION Left 05/13/2013   Procedure: BREAST LUMPECTOMY WITH RADIOACTIVE SEED LOCALIZATION;  Surgeon: Adin Hector, MD;  Location: [facility];  Service: General;  Laterality: Left;   BREAST SURGERY     breast augmentation 08/1998   DILATION AND CURETTAGE OF UTERUS  02/2009   ENDOMETRIAL BIOPSY  08/1998   MANDIBLE FRACTURE SURGERY     1988   TONSILLECTOMY     1970    Family History  Problem Relation Age of Onset   Arthritis Mother    Depression Mother    Breast cancer Mother    Arthritis Father    Cancer Father    COPD Father    Depression Father    Hypertension Father    Colon polyps Father    Prostate cancer Father    Cancer Maternal Aunt        breast   Breast cancer Maternal Aunt    Cancer Paternal Aunt        breast   Breast cancer Paternal Aunt    Birth defects  Paternal Grandfather        lung   Miscarriages / Stillbirths Sister    Colon polyps Sister    Cancer Maternal Grandfather    Heart attack Maternal Grandfather    Heart disease Maternal Grandfather    Hyperlipidemia Maternal Grandfather    Miscarriages / Stillbirths Sister    Colon cancer Neg Hx    Esophageal cancer Neg Hx    Rectal cancer Neg Hx    Stomach cancer Neg Hx     Allergies  Allergen Reactions   Petrolatum-Zinc Oxide Hives   Sulfa Antibiotics Hives    Current Medications:   Current Outpatient Medications:    Estradiol 10 MCG TABS vaginal tablet, Imvexxy Maintenance Pack 10 mcg vaginal insert  Insert 1 vaginal insert twice a week by vaginal route., Disp: , Rfl:    fexofenadine (ALLEGRA) 180 MG tablet, daily., Disp: , Rfl:    fluticasone (FLONASE) 50 MCG/ACT nasal spray, fluticasone 250 mcg-salmeterol 50 mcg/dose blistr powdr for inhalation  Inhale 1 puff twice a day by inhalation route., Disp: , Rfl:    Multiple Vitamin (MULTIVITAMIN) tablet, Take 1 tablet by mouth daily., Disp: , Rfl:    Nutritional Supplements (HIGH-PROTEIN NUTRITIONAL SHAKE PO), Take by mouth., Disp: , Rfl:    triamcinolone cream (KENALOG) 0.1 %, APPLY TO AFFECTED AREA TWICE A DAY, Disp: 80 g, Rfl: 0   valACYclovir (VALTREX) 1000 MG tablet, , Disp: , Rfl:    Review of Systems:   ROS Negative unless otherwise specified per HPI. Vitals:   Vitals:   02/27/21 1431  BP: 110/80  Pulse: 76  Temp: 98.2 F (36.8 C)  TempSrc: Temporal  SpO2: 96%  Weight: 154 lb 6.1 oz (70 kg)  Height: 5' 8.5" (1.74 m)     Body mass index is 23.13 kg/m.  Physical Exam:   Physical Exam Vitals and nursing note reviewed.  Constitutional:      General: She is not in acute distress.    Appearance: She is well-developed. She is not ill-appearing or toxic-appearing.  Cardiovascular:     Rate and Rhythm: Normal rate and regular rhythm.     Pulses: Normal pulses.     Heart sounds: Normal heart sounds, S1 normal  and S2 normal.  Pulmonary:     Effort: Pulmonary effort is normal.     Breath sounds: Normal breath sounds.  Skin:    General: Skin is warm and dry.  Neurological:     Mental Status: She is alert.     GCS: GCS eye subscore is 4. GCS verbal subscore is  5. GCS motor subscore is 6.     Comments: Normal sensation to b/l LE  Psychiatric:        Speech: Speech normal.        Behavior: Behavior normal. Behavior is cooperative.    Assessment and Plan:   Chest pain, unspecified type EKG tracing is personally reviewed.  EKG notes NSR.  No acute changes.  No exertional component. Normotensive in office. Low suspicion for ACS. Update labs, will make recommendations accordingly  In office BP was reassuring-- no need for medication   Primary insomnia Uncontrolled  Start gabapentin 100 mg nightly  May increase by 100 mg a night until 300 mg dose is reached  Follow up in 2-4 weeks  Paresthesia Unclear etiology Update blood work to check for organic cause F/u in 2-4 weeks, sooner if concerns  Family history of heart disease Recommended patient complete CT Calcium score for further evaluation if desired--provided hand out information  Update lipid panel and provide recommendations accordingly  I,Havlyn C Ratchford,acting as a scribe for [employer], PA.,have documented all relevant documentation on the behalf of Inda Coke, PA,as directed by  Inda Coke, PA while in the presence of Inda Coke, [place].  I, Inda Coke, [place], have reviewed all documentation for this visit. The documentation on 02/27/21 for the exam, diagnosis, procedures, and orders are all accurate and complete.   Inda Coke, PA-C

## 2021-02-27 NOTE — Patient Instructions (Signed)
It was great to see you!  EKG is reassuring Blood pressure is great  We are going to update your blood work  You will get a call about scheduling your calcium score (see handout)  Let's start gabapentin 100 mg nightly for sleep/menopausal symptoms/anxiety Increase by 100 mg night to 300 mg  Let's follow-up in 2-4 weeks, sooner if you have concerns.  Take care,  Jarold Motto PA-C

## 2021-02-28 LAB — CBC WITH DIFFERENTIAL/PLATELET
Basophils Absolute: 0 10*3/uL (ref 0.0–0.1)
Basophils Relative: 0.9 % (ref 0.0–3.0)
Eosinophils Absolute: 0.1 10*3/uL (ref 0.0–0.7)
Eosinophils Relative: 2.9 % (ref 0.0–5.0)
HCT: 40.4 % (ref 36.0–46.0)
Hemoglobin: 13.6 g/dL (ref 12.0–15.0)
Lymphocytes Relative: 37 % (ref 12.0–46.0)
Lymphs Abs: 1.8 10*3/uL (ref 0.7–4.0)
MCHC: 33.6 g/dL (ref 30.0–36.0)
MCV: 95.8 fl (ref 78.0–100.0)
Monocytes Absolute: 0.4 10*3/uL (ref 0.1–1.0)
Monocytes Relative: 8.7 % (ref 3.0–12.0)
Neutro Abs: 2.5 10*3/uL (ref 1.4–7.7)
Neutrophils Relative %: 50.5 % (ref 43.0–77.0)
Platelets: 282 10*3/uL (ref 150.0–400.0)
RBC: 4.22 Mil/uL (ref 3.87–5.11)
RDW: 12.1 % (ref 11.5–15.5)
WBC: 5 10*3/uL (ref 4.0–10.5)

## 2021-02-28 LAB — COMPREHENSIVE METABOLIC PANEL
ALT: 14 U/L (ref 0–35)
AST: 17 U/L (ref 0–37)
Albumin: 4.7 g/dL (ref 3.5–5.2)
Alkaline Phosphatase: 54 U/L (ref 39–117)
BUN: 15 mg/dL (ref 6–23)
CO2: 31 mEq/L (ref 19–32)
Calcium: 10 mg/dL (ref 8.4–10.5)
Chloride: 104 mEq/L (ref 96–112)
Creatinine, Ser: 0.68 mg/dL (ref 0.40–1.20)
GFR: 100.5 mL/min (ref >60.00)
Glucose, Bld: 95 mg/dL (ref 70–99)
Potassium: 4.9 mEq/L (ref 3.5–5.1)
Sodium: 142 mEq/L (ref 135–145)
Total Bilirubin: 0.6 mg/dL (ref 0.2–1.2)
Total Protein: 7.3 g/dL (ref 6.0–8.3)

## 2021-02-28 LAB — LIPID PANEL
Cholesterol: 253 mg/dL — ABNORMAL HIGH (ref 0–200)
HDL: 84.2 mg/dL (ref >39.00)
LDL Cholesterol: 142 mg/dL — ABNORMAL HIGH (ref 0–99)
NonHDL: 168.52
Total CHOL/HDL Ratio: 3
Triglycerides: 133 mg/dL (ref 0.0–149.0)
VLDL: 26.6 mg/dL (ref 0.0–40.0)

## 2021-02-28 LAB — VITAMIN B12: Vitamin B-12: 372 pg/mL (ref 211–911)

## 2021-02-28 LAB — TSH: TSH: 1.48 u[IU]/mL (ref 0.35–5.50)

## 2021-03-22 ENCOUNTER — Ambulatory Visit: Payer: BC Managed Care – PPO | Admitting: Physician Assistant

## 2021-03-22 ENCOUNTER — Other Ambulatory Visit: Payer: Self-pay | Admitting: Physician Assistant

## 2021-03-22 ENCOUNTER — Encounter: Payer: Self-pay | Admitting: Physician Assistant

## 2021-03-22 VITALS — BP 99/66 | HR 67 | Temp 98.2°F | Ht 68.5 in | Wt 155.6 lb

## 2021-03-22 DIAGNOSIS — N951 Menopausal and female climacteric states: Secondary | ICD-10-CM

## 2021-03-22 DIAGNOSIS — E538 Deficiency of other specified B group vitamins: Secondary | ICD-10-CM

## 2021-03-22 DIAGNOSIS — R202 Paresthesia of skin: Secondary | ICD-10-CM | POA: Diagnosis not present

## 2021-03-22 DIAGNOSIS — R21 Rash and other nonspecific skin eruption: Secondary | ICD-10-CM

## 2021-03-22 MED ORDER — TRIAMCINOLONE ACETONIDE 0.025 % EX CREA: TOPICAL_CREAM | CUTANEOUS | 0 refills | Status: DC

## 2021-03-22 MED ORDER — CYANOCOBALAMIN 1000 MCG/ML IJ SOLN
1000.0000 ug | Freq: Once | INTRAMUSCULAR | Status: AC
  Administered 2021-03-22: 1000 ug via INTRAMUSCULAR

## 2021-03-22 NOTE — Progress Notes (Signed)
[name] is a 51 y.o. female here for a follow up of a pre-existing problem. ? ?History of Present Illness:  ? ?Chief Complaint  ?Patient presents with  ? Follow-up  ?  Pt being seen for a follow up; Medication  ? ? ?HPI ? ?Vasomotor Sx ?[name] is currently compliant with taking gabapentin 300 mg nightly with no adverse effects. She has found that she is able to sleep well at this time which has lowered her overall irritability. Although the medication has been beneficial to her she does admit to experiencing mild lightheadedness upon waking in the morning since increasing to 300 mg. This problem usually resolves quickly and seems to not be a significant concern for [name]. Despite this she is managing well. Denies concerning sx.  ? ?Paresthesia  ?In terms of her paresthesia, [name] is still experiencing this in her bilateral calves and feet. Following our previous visit on 02/27/21, pt underwent routine labs including a vitamin B12. It was found that she had a B12 level of 372, which could possibly be the cause of this issue. At this time she is interested in trialing B12 injections for possible relief of sx.  ? ? ?Dermatitis  ?In addition to other concerns, pt has been experiencing dry and itchy skin on bilateral inner corners of hands and the inner corner of her left eyelid. States that this has been onset for several months, but was slightly improving once she began using triamcinolone 0.025% cream that was previously prescribed to her by Dr. Artis Flock. Currently she is hoping this cream could be refilled for her today. Denies concerning sx.  ? ?Denies changes to beauty products, environment, or hx of eczema.  ? ?Past Medical History:  ?Diagnosis Date  ? Allergy   ? Arthritis   ? osteo arthritist  ? GERD (gastroesophageal reflux disease)   ? OCC - uses Pepcid PRN   ? Heart murmur   ? History of chicken pox   ? History of recurrent UTIs   ? PONV (postoperative nausea and vomiting)   ? Screening for HIV (human  immunodeficiency virus)   ? ?  ?Social History  ? ?Tobacco Use  ? Smoking status: Former  ?  Types: Cigarettes  ?  Quit date: 05/03/2003  ?  Years since quitting: 17.8  ? Smokeless tobacco: Never  ?Vaping Use  ? Vaping Use: Never used  ?Substance Use Topics  ? Alcohol use: Yes  ?  Comment: social  ? Drug use: No  ? ? ?Past Surgical History:  ?Procedure Laterality Date  ? BREAST LUMPECTOMY WITH RADIOACTIVE SEED LOCALIZATION Left 05/13/2013  ? Procedure: BREAST LUMPECTOMY WITH RADIOACTIVE SEED LOCALIZATION;  Surgeon: Ernestene Mention, MD;  Location: [facility];  Service: General;  Laterality: Left;  ? BREAST SURGERY    ? breast augmentation 08/1998  ? DILATION AND CURETTAGE OF UTERUS  02/2009  ? ENDOMETRIAL BIOPSY  08/1998  ? MANDIBLE FRACTURE SURGERY    ? 1988  ? TONSILLECTOMY    ? 1970  ? ? ?Family History  ?Problem Relation Age of Onset  ? Arthritis Mother   ? Depression Mother   ? Breast cancer Mother   ? Other Mother   ?     pacemaker  ? Arthritis Father   ? COPD Father   ? Depression Father   ? Hypertension Father   ? Colon polyps Father   ? Prostate cancer Father   ? Miscarriages / Stillbirths Sister   ? Colon polyps  Sister   ? Miscarriages / Stillbirths Sister   ? Lung cancer Maternal Grandfather   ? Heart attack Maternal Grandfather   ? Heart disease Maternal Grandfather   ? Hyperlipidemia Maternal Grandfather   ? Birth defects Paternal Grandfather   ?     lung  ? Cancer Maternal Aunt   ?     breast  ? Breast cancer Maternal Aunt   ? Cancer Paternal Aunt   ?     breast  ? Breast cancer Paternal Aunt   ? Colon cancer Neg Hx   ? Esophageal cancer Neg Hx   ? Rectal cancer Neg Hx   ? Stomach cancer Neg Hx   ? ? ?Allergies  ?Allergen Reactions  ? Petrolatum-Zinc Oxide Hives  ? Sulfa Antibiotics Hives  ? ? ?Current Medications:  ? ?Current Outpatient Medications:  ?  Estradiol 10 MCG TABS vaginal tablet, Imvexxy Maintenance Pack 10 mcg vaginal insert  Insert 1 vaginal insert twice a week by vaginal route.,  Disp: , Rfl:  ?  fexofenadine (ALLEGRA) 180 MG tablet, daily., Disp: , Rfl:  ?  fluticasone (FLONASE) 50 MCG/ACT nasal spray, fluticasone 250 mcg-salmeterol 50 mcg/dose blistr powdr for inhalation  Inhale 1 puff twice a day by inhalation route., Disp: , Rfl:  ?  gabapentin (NEURONTIN) 100 MG capsule, Take one tablet (100 mg) by mouth one hour before bedtime every night. Increase by 100 mg nightly as tolerated to 300 mg., Disp: 30 capsule, Rfl: 1 ?  Multiple Vitamin (MULTIVITAMIN) tablet, Take 1 tablet by mouth daily., Disp: , Rfl:  ?  Nutritional Supplements (HIGH-PROTEIN NUTRITIONAL SHAKE PO), Take by mouth., Disp: , Rfl:  ?  triamcinolone cream (KENALOG) 0.1 %, APPLY TO AFFECTED AREA TWICE A DAY, Disp: 80 g, Rfl: 0 ?  valACYclovir (VALTREX) 1000 MG tablet, , Disp: , Rfl:   ? ?Review of Systems:  ? ?ROS ?Negative unless otherwise specified per HPI. ?Vitals:  ? ?Vitals:  ? 03/22/21 1256  ?BP: 99/66  ?Pulse: 67  ?Temp: 98.2 ?F (36.8 ?C)  ?TempSrc: Temporal  ?SpO2: 98%  ?Weight: 155 lb 9.6 oz (70.6 kg)  ?Height: 5' 8.5" (1.74 m)  ?   ?Body mass index is 23.31 kg/m?. ? ?Physical Exam:  ? ?Physical Exam ?Vitals and nursing note reviewed.  ?Constitutional:   ?   General: She is not in acute distress. ?   Appearance: She is well-developed. She is not ill-appearing or toxic-appearing.  ?Cardiovascular:  ?   Rate and Rhythm: Normal rate and regular rhythm.  ?   Pulses: Normal pulses.  ?   Heart sounds: Normal heart sounds, S1 normal and S2 normal.  ?Pulmonary:  ?   Effort: Pulmonary effort is normal.  ?   Breath sounds: Normal breath sounds.  ?Skin: ?   General: Skin is warm and dry.  ?   Comments: Raised area of erythema to bilateral medial aspect of dorsum of hand  ?Neurological:  ?   Mental Status: She is alert.  ?   GCS: GCS eye subscore is 4. GCS verbal subscore is 5. GCS motor subscore is 6.  ?Psychiatric:     ?   Speech: Speech normal.     ?   Behavior: Behavior normal. Behavior is cooperative.  ? ? ?Assessment and  Plan:  ? ?B12 deficiency/Paresthesia ?No red flags  ?Administered B12 injection today, patient tolerated well  ?Start oral B12 supplement of patient's choice daily  ?Follow up if new/worsening or lack of improvement of  symptoms occurs   ? ?Vasomotor symptoms due to menopause ?Stable ?Decrease Gabapentin back to 100 mg nightly  ?Informed patient that if lightheadedness persists, to reach out and medication will be adjusted ?Follow up as needed  ? ?Rash and nonspecific skin eruption ?No red flags--suspect dermatitis ?Apply triamcinolone 0.025% cream to face and  triamcinolone 0.1% cream to body 1-2 times daily  ?Encouraged patient to consider taking Allegra 180 mg daily  ?Low threshold to follow up with Dermatology  ?Follow up if new/worsening of symptoms or concerns occur ? ?I,Havlyn C Ratchford,acting as a scribe for [employer] CorporationSamantha Hubbert Landrigan, PA.,have documented all relevant documentation on the behalf of Jarold MottoSamantha Ismahan Lippman, PA,as directed by  Jarold MottoSamantha Zianne Schubring, PA while in the presence of Jarold MottoSamantha Trinette Vera, [place]. ? ?IJarold Motto, Johnluke Haugen, PA, have reviewed all documentation for this visit. The documentation on 03/22/21 for the exam, diagnosis, procedures, and orders are all accurate and complete. ? ?Jarold MottoSamantha Reiko Vinje, PA-C ? ?

## 2021-03-22 NOTE — Patient Instructions (Signed)
It was great to see you! ? ?Go back to gabapentin 100 mg nightly ?If lightheadedness persists, let me know ? ?B12 injection today ?Start oral B12 supplement of your choice daily ?If tingling worsens or doesn't get better, let me know ? ?Apply 0.025% cream to face ?Apply 0.1% cream to body ?Consider regular daily Allegra use ?If rash worsens, let's refer you to dermatology ? ?Take care, ? ?Inda Coke PA-C  ?

## 2021-04-16 ENCOUNTER — Ambulatory Visit (INDEPENDENT_AMBULATORY_CARE_PROVIDER_SITE_OTHER)
Admission: RE | Admit: 2021-04-16 | Discharge: 2021-04-16 | Disposition: A | Discharge status: Home / Self Care | Payer: Self-pay | Source: Ambulatory Visit | Attending: Physician Assistant | Admitting: Physician Assistant

## 2021-04-16 DIAGNOSIS — Z8249 Family history of ischemic heart disease and other diseases of the circulatory system: Secondary | ICD-10-CM

## 2021-05-04 ENCOUNTER — Other Ambulatory Visit: Payer: Self-pay | Admitting: Physician Assistant

## 2021-06-19 ENCOUNTER — Ambulatory Visit: Payer: BC Managed Care – PPO | Admitting: Physician Assistant

## 2021-06-19 ENCOUNTER — Encounter: Payer: Self-pay | Admitting: Physician Assistant

## 2021-06-19 VITALS — BP 122/80 | HR 71 | Temp 98.6°F | Ht 68.5 in | Wt 157.0 lb

## 2021-06-19 DIAGNOSIS — J0110 Acute frontal sinusitis, unspecified: Secondary | ICD-10-CM

## 2021-06-19 MED ORDER — PREDNISONE 20 MG PO TABS: 40.0000 mg | ORAL_TABLET | Freq: Every day | ORAL | 0 refills | Status: DC

## 2021-06-19 MED ORDER — AMOXICILLIN-POT CLAVULANATE 875-125 MG PO TABS: 1.0000 | ORAL_TABLET | Freq: Two times a day (BID) | ORAL | 0 refills | Status: DC

## 2021-06-19 NOTE — Progress Notes (Signed)
[name] is a 51 y.o. female here for a new problem of sinusitis.   History of Present Illness:   Chief Complaint  Patient presents with   Sinus Problem    Pt is c/o sinus headache and pressure, dry cough, bilateral ear pain x 1 week. Denies fever or chills.    HPI  Sinus problem Patient complain of sinus headache/pressure that has been onset for 1 week. Her associated symptoms include dry cough and bilateral ear pain. Has tried Dayquil and cold/flu medication to help alleviate the symptoms. States her symptoms were improving last Friday nut now this is getting worse. She woke up this morning with dry cough. She has been taking Claritin 10 mg daily for her allergies. Has not been using Flonase regularly. Thinks this might be related to [employer]. She is planning to go to beach and would like to feel better before going to beach. No sick contacts. Denies: fever, chills, n/v/d, SOB, or wheezing. Denies body aches. Has had sore throat yesterday but this has improved.   Past Medical History:  Diagnosis Date   Allergy    Arthritis    osteo arthritist   GERD (gastroesophageal reflux disease)    OCC - uses Pepcid PRN    Heart murmur    History of chicken pox    History of recurrent UTIs    PONV (postoperative nausea and vomiting)    Screening for HIV (human immunodeficiency virus)      Social History   Tobacco Use   Smoking status: Former    Types: Cigarettes    Quit date: 05/03/2003    Years since quitting: 18.1   Smokeless tobacco: Never  Vaping Use   Vaping Use: Never used  Substance Use Topics   Alcohol use: Yes    Comment: social   Drug use: No    Past Surgical History:  Procedure Laterality Date   BREAST LUMPECTOMY WITH RADIOACTIVE SEED LOCALIZATION Left 05/13/2013   Procedure: BREAST LUMPECTOMY WITH RADIOACTIVE SEED LOCALIZATION;  Surgeon: Ernestene Mention, MD;  Location: [facility];  Service: General;  Laterality: Left;   BREAST SURGERY      breast augmentation 08/1998   DILATION AND CURETTAGE OF UTERUS  02/2009   ENDOMETRIAL BIOPSY  08/1998   MANDIBLE FRACTURE SURGERY     1988   TONSILLECTOMY     1970    Family History  Problem Relation Age of Onset   Arthritis Mother    Depression Mother    Breast cancer Mother    Other Mother        pacemaker   Arthritis Father    COPD Father    Depression Father    Hypertension Father    Colon polyps Father    Prostate cancer Father    Miscarriages / [place] Sister    Colon polyps Sister    Miscarriages / Stillbirths Sister    Lung cancer Maternal Grandfather    Heart attack Maternal Grandfather    Heart disease Maternal Grandfather    Hyperlipidemia Maternal Grandfather    Birth defects Paternal Grandfather        lung   Cancer Maternal Aunt        breast   Breast cancer Maternal Aunt    Cancer Paternal Aunt        breast   Breast cancer Paternal Aunt    Colon cancer Neg Hx    Esophageal cancer Neg Hx    Rectal cancer Neg Hx  Stomach cancer Neg Hx     Allergies  Allergen Reactions   Petrolatum-Zinc Oxide Hives   Sulfa Antibiotics Hives    Current Medications:   Current Outpatient Medications:    amoxicillin-clavulanate (AUGMENTIN) 875-125 MG tablet, Take 1 tablet by mouth 2 (two) times daily., Disp: 10 tablet, Rfl: 0   Estradiol 10 MCG TABS vaginal tablet, Imvexxy Maintenance Pack 10 mcg vaginal insert  Insert 1 vaginal insert twice a week by vaginal route., Disp: , Rfl:    fluticasone (FLONASE) 50 MCG/ACT nasal spray, fluticasone 250 mcg-salmeterol 50 mcg/dose blistr powdr for inhalation  Inhale 1 puff twice a day by inhalation route., Disp: , Rfl:    gabapentin (NEURONTIN) 100 MG capsule, TAKE 1 CAPSULE 1 HOUR BEFORE BEDTIME EVERY NIGHT. INCREASE BY 1 CAPS NIGHTLY AS TOLERATED TO 3 CAPS, Disp: 30 capsule, Rfl: 1   loratadine (CLARITIN) 10 MG tablet, Take 10 mg by mouth daily., Disp: , Rfl:    Multiple Vitamin (MULTIVITAMIN) tablet, Take 1 tablet by  mouth daily., Disp: , Rfl:    Nutritional Supplements (HIGH-PROTEIN NUTRITIONAL SHAKE PO), Take by mouth., Disp: , Rfl:    predniSONE (DELTASONE) 20 MG tablet, Take 2 tablets (40 mg total) by mouth daily., Disp: 10 tablet, Rfl: 0   triamcinolone (KENALOG) 0.025 % cream, Apply to affected area on face 1-2 times daily, Disp: 80 g, Rfl: 0   triamcinolone cream (KENALOG) 0.1 %, APPLY TO AFFECTED AREA TWICE A DAY, Disp: 80 g, Rfl: 0   valACYclovir (VALTREX) 1000 MG tablet, Take 1,000 mg by mouth as needed., Disp: , Rfl:    Review of Systems:   ROS Negative unless otherwise specified per HPI.   Vitals:   Vitals:   06/19/21 1304  BP: 122/80  Pulse: 71  Temp: 98.6 F (37 C)  TempSrc: Temporal  SpO2: 98%  Weight: 157 lb (71.2 kg)  Height: 5' 8.5" (1.74 m)     Body mass index is 23.52 kg/m.  Physical Exam:   Physical Exam Vitals and nursing note reviewed.  Constitutional:      General: She is not in acute distress.    Appearance: She is well-developed. She is not ill-appearing or toxic-appearing.  HENT:     Head: Normocephalic and atraumatic.     Right Ear: Tympanic membrane, ear canal and external ear normal. Tympanic membrane is not erythematous, retracted or bulging.     Left Ear: Tympanic membrane, ear canal and external ear normal. Tympanic membrane is not erythematous, retracted or bulging.     Nose:     Right Sinus: Frontal sinus tenderness present. No maxillary sinus tenderness.     Left Sinus: Frontal sinus tenderness present. No maxillary sinus tenderness.     Mouth/Throat:     Pharynx: Uvula midline. No posterior oropharyngeal erythema.  Eyes:     General: Lids are normal.     Conjunctiva/sclera: Conjunctivae normal.  Neck:     Trachea: Trachea normal.  Cardiovascular:     Rate and Rhythm: Normal rate and regular rhythm.     Heart sounds: Normal heart sounds, S1 normal and S2 normal.  Pulmonary:     Effort: Pulmonary effort is normal.     Breath sounds: Normal  breath sounds. No decreased breath sounds, wheezing, rhonchi or rales.  Lymphadenopathy:     Cervical: No cervical adenopathy.  Skin:    General: Skin is warm and dry.  Neurological:     Mental Status: She is alert.  Psychiatric:  Speech: Speech normal.        Behavior: Behavior normal. Behavior is cooperative.     Assessment and Plan:   Acute non-recurrent frontal sinusitis No red flags on exam.  Will initiate oral augmentin and prednisone per orders. Discussed taking medications as prescribed. Reviewed return precautions including worsening fever, SOB, worsening cough or other concerns. Push fluids and rest. I recommend that patient follow-up if symptoms worsen or persist despite treatment x 7-10 days, sooner if needed.  I,Savera Zaman,acting as a Neurosurgeon for [employer], PA.,have documented all relevant documentation on the behalf of Jarold Motto, PA,as directed by  Jarold Motto, PA while in the presence of Jarold Motto, [place].   I, Jarold Motto, [place], have reviewed all documentation for this visit. The documentation on 06/19/21 for the exam, diagnosis, procedures, and orders are all accurate and complete.   Jarold Motto, PA-C

## 2021-06-19 NOTE — Patient Instructions (Signed)
It was great to see you!  Start prednisone and augmentin antibiotic. Hydrate well and keep me posted on your symptoms.   Take care,  Jarold Motto PA-C

## 2021-06-22 ENCOUNTER — Other Ambulatory Visit: Payer: Self-pay | Admitting: Physician Assistant

## 2021-07-01 ENCOUNTER — Encounter: Payer: Self-pay | Admitting: Physician Assistant

## 2021-07-16 ENCOUNTER — Other Ambulatory Visit: Payer: Self-pay | Admitting: Obstetrics & Gynecology

## 2021-07-16 DIAGNOSIS — R928 Other abnormal and inconclusive findings on diagnostic imaging of breast: Secondary | ICD-10-CM

## 2021-07-16 DIAGNOSIS — N6489 Other specified disorders of breast: Secondary | ICD-10-CM

## 2021-07-17 DIAGNOSIS — Z1151 Encounter for screening for human papillomavirus (HPV): Secondary | ICD-10-CM | POA: Diagnosis not present

## 2021-07-17 DIAGNOSIS — Z124 Encounter for screening for malignant neoplasm of cervix: Secondary | ICD-10-CM | POA: Diagnosis not present

## 2021-07-17 DIAGNOSIS — Z01419 Encounter for gynecological examination (general) (routine) without abnormal findings: Secondary | ICD-10-CM | POA: Diagnosis not present

## 2021-07-17 DIAGNOSIS — N952 Postmenopausal atrophic vaginitis: Secondary | ICD-10-CM | POA: Diagnosis not present

## 2021-07-17 DIAGNOSIS — Z6824 Body mass index (BMI) 24.0-24.9, adult: Secondary | ICD-10-CM | POA: Diagnosis not present

## 2021-07-17 DIAGNOSIS — G4700 Insomnia, unspecified: Secondary | ICD-10-CM | POA: Diagnosis not present

## 2021-07-17 LAB — HM PAP SMEAR

## 2021-07-17 LAB — RESULTS CONSOLE HPV: CHL HPV: NEGATIVE

## 2021-07-22 ENCOUNTER — Ambulatory Visit
Admission: RE | Admit: 2021-07-22 | Discharge: 2021-07-22 | Disposition: A | Discharge status: Home / Self Care | Payer: BC Managed Care – PPO | Source: Ambulatory Visit | Attending: Obstetrics & Gynecology | Admitting: Obstetrics & Gynecology

## 2021-07-22 ENCOUNTER — Ambulatory Visit: Payer: BC Managed Care – PPO

## 2021-07-22 DIAGNOSIS — R922 Inconclusive mammogram: Secondary | ICD-10-CM | POA: Diagnosis not present

## 2021-07-22 DIAGNOSIS — N6489 Other specified disorders of breast: Secondary | ICD-10-CM

## 2021-08-07 ENCOUNTER — Encounter: Payer: Self-pay | Admitting: Physician Assistant

## 2021-08-27 DIAGNOSIS — N959 Unspecified menopausal and perimenopausal disorder: Secondary | ICD-10-CM | POA: Diagnosis not present

## 2021-09-30 ENCOUNTER — Encounter: Payer: Self-pay | Admitting: *Deleted

## 2021-12-17 DIAGNOSIS — L814 Other melanin hyperpigmentation: Secondary | ICD-10-CM | POA: Diagnosis not present

## 2021-12-17 DIAGNOSIS — L738 Other specified follicular disorders: Secondary | ICD-10-CM | POA: Diagnosis not present

## 2021-12-17 DIAGNOSIS — D2271 Melanocytic nevi of right lower limb, including hip: Secondary | ICD-10-CM | POA: Diagnosis not present

## 2021-12-17 DIAGNOSIS — L820 Inflamed seborrheic keratosis: Secondary | ICD-10-CM | POA: Diagnosis not present

## 2021-12-17 DIAGNOSIS — L918 Other hypertrophic disorders of the skin: Secondary | ICD-10-CM | POA: Diagnosis not present

## 2021-12-19 ENCOUNTER — Encounter: Payer: Self-pay | Admitting: *Deleted

## 2022-01-01 DIAGNOSIS — Z20822 Contact with and (suspected) exposure to covid-19: Secondary | ICD-10-CM | POA: Diagnosis not present

## 2022-01-01 DIAGNOSIS — J0140 Acute pansinusitis, unspecified: Secondary | ICD-10-CM | POA: Diagnosis not present

## 2022-01-02 ENCOUNTER — Ambulatory Visit: Payer: BC Managed Care – PPO | Admitting: Physician Assistant

## 2022-01-08 ENCOUNTER — Ambulatory Visit: Payer: BC Managed Care – PPO | Admitting: Physician Assistant

## 2022-01-08 ENCOUNTER — Encounter: Payer: Self-pay | Admitting: Physician Assistant

## 2022-01-08 VITALS — BP 130/88 | HR 65 | Temp 97.5°F | Ht 68.5 in | Wt 163.2 lb

## 2022-01-08 DIAGNOSIS — J0110 Acute frontal sinusitis, unspecified: Secondary | ICD-10-CM | POA: Diagnosis not present

## 2022-01-08 MED ORDER — MONTELUKAST SODIUM 10 MG PO TABS: 10.0000 mg | ORAL_TABLET | Freq: Every day | ORAL | 3 refills | Status: DC

## 2022-01-08 MED ORDER — PREDNISONE 20 MG PO TABS: 40.0000 mg | ORAL_TABLET | Freq: Every day | ORAL | 0 refills | Status: DC

## 2022-01-08 NOTE — Patient Instructions (Addendum)
May use over the counter antihistamines such as Zyrtec (cetirizine), Claritin (loratadine), Allegra (fexofenadine), or Xyzal (levocetirizine) daily as needed. May take twice a day if needed as long as it does not cause drowsiness. May try Singulair 10mg  daily at night.  Continue Flonase 1 spray twice a day. Nasal saline spray (i.e., Simply Saline) or nasal saline lavage (i.e., NeilMed) is recommended as needed and prior to medicated nasal sprays.   Start daily prednisone  Follow-up as needed!

## 2022-01-08 NOTE — Progress Notes (Signed)
[name] is a 52 y.o. female here for a new problem.  History of Present Illness:   Chief Complaint  Patient presents with   Cough    Pt c/o cough and chest congestion, ears feel full, sinus pressure. Went to UC on 12/27 was Tx for Sinusitis with antibiotic, today is last day.    HPI  Congestion Seen at urgent care on 01/01/22. Started on Augmentin 7 day course at that time- on last day of antibiotic today. She has had minimal change in symptoms since UC. Most bothersome symptom is ear pressure/fullness. She notes accompanying cough, chest congestion, and sinus pressure. Taking Sudafed, Tylenol cold and sinus and using Flonase. She denies any fever, chills, nausea, or vomiting.  She reports having similar symptoms at least twice per year with seasonal changes. She takes Claritin or Allegra daily. Has tried Xyzal in the past but feels groggy in the mornings if she takes it the night before.   Past Medical History:  Diagnosis Date   Allergy    Arthritis    osteo arthritist   GERD (gastroesophageal reflux disease)    OCC - uses Pepcid PRN    Heart murmur    History of chicken pox    History of recurrent UTIs    PONV (postoperative nausea and vomiting)    Screening for HIV (human immunodeficiency virus)      Social History   Tobacco Use   Smoking status: Former    Types: Cigarettes    Quit date: 05/03/2003    Years since quitting: 18.6   Smokeless tobacco: Never  Vaping Use   Vaping Use: Never used  Substance Use Topics   Alcohol use: Yes    Comment: social   Drug use: No    Past Surgical History:  Procedure Laterality Date   BREAST LUMPECTOMY WITH RADIOACTIVE SEED LOCALIZATION Left 05/13/2013   Procedure: BREAST LUMPECTOMY WITH RADIOACTIVE SEED LOCALIZATION;  Surgeon: Adin Hector, MD;  Location: [facility];  Service: General;  Laterality: Left;   BREAST SURGERY     breast augmentation 08/1998   DILATION AND CURETTAGE OF UTERUS  02/2009    ENDOMETRIAL BIOPSY  08/1998   MANDIBLE FRACTURE SURGERY     1988   TONSILLECTOMY     1970    Family History  Problem Relation Age of Onset   Arthritis Mother    Depression Mother    Breast cancer Mother    Other Mother        pacemaker   Arthritis Father    COPD Father    Depression Father    Hypertension Father    Colon polyps Father    Prostate cancer Father    Miscarriages / [place] Sister    Colon polyps Sister    Miscarriages / Stillbirths Sister    Lung cancer Maternal Grandfather    Heart attack Maternal Grandfather    Heart disease Maternal Grandfather    Hyperlipidemia Maternal Grandfather    Birth defects Paternal Grandfather        lung   Cancer Maternal Aunt        breast   Breast cancer Maternal Aunt    Cancer Paternal Aunt        breast   Breast cancer Paternal Aunt    Colon cancer Neg Hx    Esophageal cancer Neg Hx    Rectal cancer Neg Hx    Stomach cancer Neg Hx     Allergies  Allergen Reactions  Petrolatum-Zinc Oxide Hives   Sulfa Antibiotics Hives    Current Medications:   Current Outpatient Medications:    amoxicillin-clavulanate (AUGMENTIN) 875-125 MG tablet, Take 1 tablet by mouth 2 (two) times daily., Disp: 10 tablet, Rfl: 0   estradiol (CLIMARA - DOSED IN MG/24 HR) 0.05 mg/24hr patch, Place 0.05 mg onto the skin once a week., Disp: , Rfl:    fluticasone (FLONASE) 50 MCG/ACT nasal spray, fluticasone 250 mcg-salmeterol 50 mcg/dose blistr powdr for inhalation  Inhale 1 puff twice a day by inhalation route., Disp: , Rfl:    loratadine (CLARITIN) 10 MG tablet, Take 10 mg by mouth daily., Disp: , Rfl:    Multiple Vitamin (MULTIVITAMIN) tablet, Take 1 tablet by mouth daily., Disp: , Rfl:    progesterone (PROMETRIUM) 200 MG capsule, Take 200 mg by mouth daily., Disp: , Rfl:    triamcinolone (KENALOG) 0.025 % cream, Apply to affected area on face 1-2 times daily, Disp: 80 g, Rfl: 0   triamcinolone cream (KENALOG) 0.1 %, APPLY TO AFFECTED AREA  TWICE A DAY, Disp: 80 g, Rfl: 0   valACYclovir (VALTREX) 1000 MG tablet, Take 1,000 mg by mouth as needed., Disp: , Rfl:    Review of Systems:   Review of Systems  Constitutional:  Negative for chills and fever.  HENT:  Positive for congestion.        + sinus pressure + ear fullness/pressure  Respiratory:  Positive for cough.     Vitals:   Vitals:   01/08/22 1524  BP: (!) 140/90  Pulse: 65  Temp: (!) 97.5 F (36.4 C)  TempSrc: Temporal  SpO2: 98%  Weight: 163 lb 4 oz (74 kg)  Height: 5' 8.5" (1.74 m)     Body mass index is 24.46 kg/m.  Physical Exam:   Physical Exam Vitals and nursing note reviewed.  Constitutional:      General: She is not in acute distress.    Appearance: She is well-developed. She is not ill-appearing or toxic-appearing.  HENT:     Head: Normocephalic and atraumatic.     Right Ear: Tympanic membrane, ear canal and external ear normal. Tympanic membrane is not erythematous, retracted or bulging.     Left Ear: Tympanic membrane, ear canal and external ear normal. Tympanic membrane is not erythematous, retracted or bulging.     Nose:     Right Sinus: Frontal sinus tenderness present. No maxillary sinus tenderness.     Left Sinus: Frontal sinus tenderness present. No maxillary sinus tenderness.     Mouth/Throat:     Pharynx: Uvula midline. No posterior oropharyngeal erythema.  Eyes:     General: Lids are normal.     Conjunctiva/sclera: Conjunctivae normal.  Neck:     Trachea: Trachea normal.  Cardiovascular:     Rate and Rhythm: Normal rate and regular rhythm.     Heart sounds: Normal heart sounds, S1 normal and S2 normal.  Pulmonary:     Effort: Pulmonary effort is normal.     Breath sounds: Normal breath sounds. No decreased breath sounds, wheezing, rhonchi or rales.  Lymphadenopathy:     Cervical: No cervical adenopathy.  Skin:    General: Skin is warm and dry.  Neurological:     Mental Status: She is alert.  Psychiatric:         Speech: Speech normal.        Behavior: Behavior normal. Behavior is cooperative.     Assessment and Plan:   Acute frontal sinusitis, recurrence not  specified No red flags Will initiate prednisone and singulair per orders.  Consider increasing antihistamine to twice daily if not sedating. Consider adding saline nasal spray prior to medicated nasal spray. Discussed taking medications as prescribed. Reviewed return precautions including worsening fever, SOB, worsening cough or other concerns. Push fluids and rest. I recommend that patient follow-up if symptoms worsen or persist despite treatment x 7-10 days, sooner if needed.  I,Alexis Herring,acting as a Education administrator for [employer], PA.,have documented all relevant documentation on the behalf of Inda Coke, PA,as directed by  Inda Coke, PA while in the presence of Inda Coke, [place].  I, Inda Coke, [place], have reviewed all documentation for this visit. The documentation on 01/08/22 for the exam, diagnosis, procedures, and orders are all accurate and complete.  Inda Coke, PA-C

## 2022-01-22 ENCOUNTER — Ambulatory Visit: Payer: BC Managed Care – PPO | Admitting: Physician Assistant

## 2022-01-22 ENCOUNTER — Encounter: Payer: Self-pay | Admitting: Physician Assistant

## 2022-01-22 VITALS — BP 126/80 | HR 83 | Temp 97.7°F | Ht 68.5 in | Wt 167.4 lb

## 2022-01-22 DIAGNOSIS — R0981 Nasal congestion: Secondary | ICD-10-CM | POA: Diagnosis not present

## 2022-01-22 LAB — POC INFLUENZA A&B (BINAX/QUICKVUE)
Influenza A, POC: NEGATIVE
Influenza B, POC: NEGATIVE

## 2022-01-22 LAB — POC COVID19 BINAXNOW: SARS Coronavirus 2 Ag: NEGATIVE

## 2022-01-22 MED ORDER — DOXYCYCLINE HYCLATE 100 MG PO TABS: 100.0000 mg | ORAL_TABLET | Freq: Two times a day (BID) | ORAL | 0 refills | Status: DC

## 2022-01-22 NOTE — Progress Notes (Signed)
[name] is a 52 y.o. female here for a follow up of a pre-existing problem.  History of Present Illness:   Chief Complaint  Patient presents with  . Sinus Problem    Pt still c/o sinus congestion , headaches, sneezing x 1 months, was here 2 weeks ago, has never gone away, gets a little better an comes back.    HPI  Sinus Prolem Seen at urgent care on 01/01/22. Started on Augmentin 7 day course at that time. Pt started on Singulair 10mg  nightly at our last visit on 01/08/22 for persistent symptoms.  Symptoms have persisted but did slightly improve, however last night she had sudden worsening of sx.  Sinus symptoms and headaches became severe last night.  She has a productive cough with green sputum and clear nasal drainage. She is managing with Singulair, Flonase and Vicks VapoRub applied to her chest. She denies having a fever. Denies sick contacts or recent travel.   Past Medical History:  Diagnosis Date  . Allergy   . Arthritis    osteo arthritist  . GERD (gastroesophageal reflux disease)    OCC - uses Pepcid PRN   . Heart murmur   . History of chicken pox   . History of recurrent UTIs   . PONV (postoperative nausea and vomiting)   . Screening for HIV (human immunodeficiency virus)      Social History   Tobacco Use  . Smoking status: Former    Types: Cigarettes    Quit date: 05/03/2003    Years since quitting: 18.7  . Smokeless tobacco: Never  Vaping Use  . Vaping Use: Never used  Substance Use Topics  . Alcohol use: Yes    Comment: social  . Drug use: No    Past Surgical History:  Procedure Laterality Date  . BREAST LUMPECTOMY WITH RADIOACTIVE SEED LOCALIZATION Left 05/13/2013   Procedure: BREAST LUMPECTOMY WITH RADIOACTIVE SEED LOCALIZATION;  Surgeon: Adin Hector, MD;  Location: [facility];  Service: General;  Laterality: Left;  . BREAST SURGERY     breast augmentation 08/1998  . DILATION AND CURETTAGE OF UTERUS  02/2009  .  ENDOMETRIAL BIOPSY  08/1998  . [place]  . TONSILLECTOMY     1970    Family History  Problem Relation Age of Onset  . Arthritis Mother   . Depression Mother   . Breast cancer Mother   . Other Mother        pacemaker  . Arthritis Father   . COPD Father   . Depression Father   . Hypertension Father   . Colon polyps Father   . Prostate cancer Father   . Miscarriages / Stillbirths Sister   . Colon polyps Sister   . Miscarriages / Stillbirths Sister   . Lung cancer Maternal Grandfather   . Heart attack Maternal Grandfather   . Heart disease Maternal Grandfather   . Hyperlipidemia Maternal Grandfather   . Birth defects Paternal Grandfather        lung  . Cancer Maternal Aunt        breast  . Breast cancer Maternal Aunt   . Cancer Paternal Aunt        breast  . Breast cancer Paternal Aunt   . Colon cancer Neg Hx   . Esophageal cancer Neg Hx   . Rectal cancer Neg Hx   . Stomach cancer Neg Hx     Allergies  Allergen Reactions  .  Petrolatum-Zinc Oxide Hives  . Sulfa Antibiotics Hives    Current Medications:   Current Outpatient Medications:  .  estradiol (CLIMARA - DOSED IN MG/24 HR) 0.05 mg/24hr patch, Place 0.05 mg onto the skin once a week., Disp: , Rfl:  .  fluticasone (FLONASE) 50 MCG/ACT nasal spray, fluticasone 250 mcg-salmeterol 50 mcg/dose blistr powdr for inhalation  Inhale 1 puff twice a day by inhalation route., Disp: , Rfl:  .  loratadine (CLARITIN) 10 MG tablet, Take 10 mg by mouth daily., Disp: , Rfl:  .  montelukast (SINGULAIR) 10 MG tablet, Take 1 tablet (10 mg total) by mouth at bedtime., Disp: 30 tablet, Rfl: 3 .  Multiple Vitamin (MULTIVITAMIN) tablet, Take 1 tablet by mouth daily., Disp: , Rfl:  .  progesterone (PROMETRIUM) 200 MG capsule, Take 200 mg by mouth daily., Disp: , Rfl:  .  triamcinolone (KENALOG) 0.025 % cream, Apply to affected area on face 1-2 times daily, Disp: 80 g, Rfl: 0 .  triamcinolone cream (KENALOG) 0.1  %, APPLY TO AFFECTED AREA TWICE A DAY, Disp: 80 g, Rfl: 0 .  valACYclovir (VALTREX) 1000 MG tablet, Take 1,000 mg by mouth as needed., Disp: , Rfl:    Review of Systems:   Review of Systems  Constitutional:  Negative for fever.  HENT:  Positive for congestion, sinus pain and sore throat.   Respiratory:  Positive for cough and sputum production (Green). Negative for shortness of breath and wheezing.   Cardiovascular:  Negative for chest pain and palpitations.  Gastrointestinal:  Negative for blood in stool, constipation, diarrhea, nausea and vomiting.  Genitourinary:  Negative for dysuria, frequency and hematuria.  Musculoskeletal:  Negative for joint pain and myalgias.  Neurological:  Positive for headaches.    Vitals:   Vitals:   01/22/22 1534  BP: 126/80  Pulse: 83  Temp: 97.7 F (36.5 C)  TempSrc: Temporal  SpO2: 98%  Weight: 167 lb 6.1 oz (75.9 kg)  Height: 5' 8.5" (1.74 m)     Body mass index is 25.08 kg/m.  Physical Exam:   Physical Exam Vitals and nursing note reviewed.  Constitutional:      General: She is not in acute distress.    Appearance: She is well-developed. She is not ill-appearing or toxic-appearing.  HENT:     Head: Normocephalic and atraumatic.     Right Ear: Ear canal and external ear normal. A middle ear effusion is present. Tympanic membrane is not erythematous, retracted or bulging.     Left Ear: Ear canal and external ear normal. A middle ear effusion is present. Tympanic membrane is not erythematous, retracted or bulging.     Nose:     Right Sinus: Frontal sinus tenderness present. No maxillary sinus tenderness.     Left Sinus: Frontal sinus tenderness present. No maxillary sinus tenderness.     Mouth/Throat:     Pharynx: Uvula midline. No posterior oropharyngeal erythema.  Eyes:     General: Lids are normal.     Conjunctiva/sclera: Conjunctivae normal.  Neck:     Trachea: Trachea normal.  Cardiovascular:     Rate and Rhythm: Normal rate  and regular rhythm.     Heart sounds: Normal heart sounds, S1 normal and S2 normal.  Pulmonary:     Effort: Pulmonary effort is normal.     Breath sounds: Normal breath sounds. No decreased breath sounds, wheezing, rhonchi or rales.  Lymphadenopathy:     Cervical: No cervical adenopathy.  Skin:    General:  Skin is warm and dry.  Neurological:     Mental Status: She is alert.  Psychiatric:        Speech: Speech normal.        Behavior: Behavior normal. Behavior is cooperative.    Results for orders placed or performed in visit on 01/22/22  POC Influenza A&B(BINAX/QUICKVUE)  Result Value Ref Range   Influenza A, POC Negative Negative   Influenza B, POC Negative Negative  POC COVID-19  Result Value Ref Range   SARS Coronavirus 2 Ag Negative Negative    Assessment and Plan:   Sinus congestion No red flags on exam.  Will initiate doxycycline per orders.  I did recommend that she repeat a COVID test in another 2 to 3 days as it may be too early for her to test given her recent sudden onset worsening of symptoms.  Discussed taking medications as prescribed. Reviewed return precautions including worsening fever, SOB, worsening cough or other concerns. Push fluids and rest. I recommend that patient follow-up if symptoms worsen or persist despite treatment x 7-10 days, sooner if needed.  Will we will go ahead and refer to ENT.  May need to consider CT scan of sinuses if symptoms persist    I,Alexis Herring,acting as a scribe for [employer], PA.,have documented all relevant documentation on the behalf of Inda Coke, PA,as directed by  Inda Coke, PA while in the presence of Inda Coke, [place].  I, Inda Coke, [place], have reviewed all documentation for this visit. The documentation on 01/22/22 for the exam, diagnosis, procedures, and orders are all accurate and complete.   Inda Coke, PA-C

## 2022-01-22 NOTE — Patient Instructions (Signed)
It was great to see you!  Start doxycycline antibiotic If symptoms still persist, next step would be a head CT    Take care,  Inda Coke PA-C

## 2022-02-28 ENCOUNTER — Ambulatory Visit: Payer: BC Managed Care – PPO | Admitting: Family

## 2022-02-28 ENCOUNTER — Encounter: Payer: Self-pay | Admitting: Family

## 2022-02-28 VITALS — BP 129/76 | HR 61 | Temp 97.3°F | Ht 68.5 in | Wt 164.2 lb

## 2022-02-28 DIAGNOSIS — J020 Streptococcal pharyngitis: Secondary | ICD-10-CM | POA: Diagnosis not present

## 2022-02-28 LAB — POCT RAPID STREP A (OFFICE): Rapid Strep A Screen: POSITIVE — AB

## 2022-02-28 MED ORDER — AMOXICILLIN 500 MG PO CAPS: 500.0000 mg | ORAL_CAPSULE | Freq: Two times a day (BID) | ORAL | 0 refills | Status: AC

## 2022-02-28 NOTE — Progress Notes (Signed)
Patient ID: [name], female    DOB: 04-20-1970, 52 y.o.   MRN: [id number]  Chief Complaint  Patient presents with   Sore Throat    Pt states this is 4th time seeing someone since dec 26th, pt states she has been on 2 antibiotics and steroids. Pt states cleared up after 2nd antibiotic but never completely went away. Pt states when she tries to get mucus out, its greenish     HPI:    Sore throat:   Pt states this is 4th time seeing someone since dec 26th, pt states she has been on 2 antibiotics and steroids. Pt states her sx cleared up after 2nd antibiotic but never completely went away. Pt states when she tries to get mucus out, its greenish or sometimes bloody. States she is waiting to hear from ENT. Her throat started hurting a few days ago and it did not hurt this bad when she was having her sinus infections.   Assessment & Plan:  1. Strep sore throat - sending AMOX, advised on use & SE. Advised pt to take Ibuprofen '600mg'$  tid prn for sore throat pain, swelling, and fever. Gargle with warm salt water several tid. OK to use OTC Chloraseptic spray and/or throat lozenges prn. Drink plenty of water.   - POCT rapid strep A - amoxicillin (AMOXIL) 500 MG capsule; Take 1 capsule (500 mg total) by mouth 2 (two) times daily for 10 days.  Dispense: 20 capsule; Refill: 0   Subjective:    Outpatient Medications Prior to Visit  Medication Sig Dispense Refill   doxycycline (VIBRA-TABS) 100 MG tablet Take 1 tablet (100 mg total) by mouth 2 (two) times daily. 14 tablet 0   estradiol (CLIMARA - DOSED IN MG/24 HR) 0.05 mg/24hr patch Place 0.05 mg onto the skin once a week.     fluticasone (FLONASE) 50 MCG/ACT nasal spray fluticasone 250 mcg-salmeterol 50 mcg/dose blistr powdr for inhalation  Inhale 1 puff twice a day by inhalation route.     loratadine (CLARITIN) 10 MG tablet Take 10 mg by mouth daily.     montelukast (SINGULAIR) 10 MG tablet Take 1 tablet (10 mg total) by mouth at bedtime. 30  tablet 3   Multiple Vitamin (MULTIVITAMIN) tablet Take 1 tablet by mouth daily.     progesterone (PROMETRIUM) 200 MG capsule Take 200 mg by mouth daily.     triamcinolone (KENALOG) 0.025 % cream Apply to affected area on face 1-2 times daily 80 g 0   triamcinolone cream (KENALOG) 0.1 % APPLY TO AFFECTED AREA TWICE A DAY 80 g 0   valACYclovir (VALTREX) 1000 MG tablet Take 1,000 mg by mouth as needed.     No facility-administered medications prior to visit.   Past Medical History:  Diagnosis Date   Allergy    Arthritis    osteo arthritist   GERD (gastroesophageal reflux disease)    OCC - uses Pepcid PRN    Heart murmur    History of chicken pox    History of recurrent UTIs    PONV (postoperative nausea and vomiting)    Screening for HIV (human immunodeficiency virus)    Past Surgical History:  Procedure Laterality Date   BREAST LUMPECTOMY WITH RADIOACTIVE SEED LOCALIZATION Left 05/13/2013   Procedure: BREAST LUMPECTOMY WITH RADIOACTIVE SEED LOCALIZATION;  Surgeon: Adin Hector, MD;  Location: [facility];  Service: General;  Laterality: Left;   BREAST SURGERY     breast augmentation 08/1998   DILATION  AND CURETTAGE OF UTERUS  02/2009   ENDOMETRIAL BIOPSY  08/1998   MANDIBLE FRACTURE SURGERY     1988   TONSILLECTOMY     1970   Allergies  Allergen Reactions   Petrolatum-Zinc Oxide Hives   Sulfa Antibiotics Hives      Objective:    Physical Exam Vitals and nursing note reviewed.  Constitutional:      Appearance: Normal appearance.     Interventions: Face mask in place.  HENT:     Right Ear: Tympanic membrane and ear canal normal.     Left Ear: Tympanic membrane and ear canal normal.     Nose:     Right Sinus: No frontal sinus tenderness.     Left Sinus: No frontal sinus tenderness.     Mouth/Throat:     Mouth: Mucous membranes are moist.     Pharynx: Posterior oropharyngeal erythema present. No pharyngeal swelling, oropharyngeal exudate or uvula  swelling.     Tonsils: 0 on the right. 0 on the left.  Cardiovascular:     Rate and Rhythm: Normal rate and regular rhythm.  Pulmonary:     Effort: Pulmonary effort is normal.     Breath sounds: Normal breath sounds.  Musculoskeletal:        General: Normal range of motion.  Lymphadenopathy:     Head:     Right side of head: No preauricular or posterior auricular adenopathy.     Left side of head: No preauricular or posterior auricular adenopathy.     Cervical: Cervical adenopathy present.     Right cervical: Superficial cervical adenopathy present.     Left cervical: Superficial cervical adenopathy present.     Upper Body:     Right upper body: No supraclavicular adenopathy.     Left upper body: No supraclavicular adenopathy.  Skin:    General: Skin is warm and dry.  Neurological:     Mental Status: She is alert.  Psychiatric:        Mood and Affect: Mood normal.        Behavior: Behavior normal.    BP 129/76 (BP Location: Left Arm, Patient Position: Sitting)   Pulse 61   Temp (!) 97.3 F (36.3 C) (Temporal)   Ht 5' 8.5" (1.74 m)   Wt 164 lb 3.2 oz (74.5 kg)   LMP 05/27/2021 (Exact Date)   SpO2 100%   BMI 24.60 kg/m  Wt Readings from Last 3 Encounters:  02/28/22 164 lb 3.2 oz (74.5 kg)  01/22/22 167 lb 6.1 oz (75.9 kg)  01/08/22 163 lb 4 oz (74 kg)      Jeanie Sewer, NP

## 2022-03-04 ENCOUNTER — Encounter: Payer: Self-pay | Admitting: *Deleted

## 2022-03-14 ENCOUNTER — Ambulatory Visit: Payer: BC Managed Care – PPO | Admitting: Family

## 2022-03-17 DIAGNOSIS — H93291 Other abnormal auditory perceptions, right ear: Secondary | ICD-10-CM | POA: Diagnosis not present

## 2022-03-17 DIAGNOSIS — J329 Chronic sinusitis, unspecified: Secondary | ICD-10-CM | POA: Diagnosis not present

## 2022-03-28 DIAGNOSIS — J329 Chronic sinusitis, unspecified: Secondary | ICD-10-CM | POA: Diagnosis not present

## 2022-05-14 ENCOUNTER — Other Ambulatory Visit: Payer: Self-pay | Admitting: Physician Assistant

## 2022-06-12 DIAGNOSIS — M7989 Other specified soft tissue disorders: Secondary | ICD-10-CM | POA: Diagnosis not present

## 2022-06-12 DIAGNOSIS — M79661 Pain in right lower leg: Secondary | ICD-10-CM | POA: Diagnosis not present

## 2022-06-12 DIAGNOSIS — R600 Localized edema: Secondary | ICD-10-CM | POA: Diagnosis not present

## 2022-06-12 DIAGNOSIS — I781 Nevus, non-neoplastic: Secondary | ICD-10-CM | POA: Diagnosis not present

## 2022-06-12 DIAGNOSIS — R252 Cramp and spasm: Secondary | ICD-10-CM | POA: Diagnosis not present

## 2022-06-12 DIAGNOSIS — I87393 Chronic venous hypertension (idiopathic) with other complications of bilateral lower extremity: Secondary | ICD-10-CM | POA: Diagnosis not present

## 2022-06-17 ENCOUNTER — Encounter: Payer: Self-pay | Admitting: Neurology

## 2022-08-14 ENCOUNTER — Other Ambulatory Visit: Payer: Self-pay | Admitting: Physician Assistant

## 2022-08-25 DIAGNOSIS — Z01419 Encounter for gynecological examination (general) (routine) without abnormal findings: Secondary | ICD-10-CM | POA: Diagnosis not present

## 2022-08-25 DIAGNOSIS — Z6824 Body mass index (BMI) 24.0-24.9, adult: Secondary | ICD-10-CM | POA: Diagnosis not present

## 2022-08-26 ENCOUNTER — Other Ambulatory Visit: Payer: Self-pay | Admitting: Obstetrics & Gynecology

## 2022-08-26 DIAGNOSIS — R928 Other abnormal and inconclusive findings on diagnostic imaging of breast: Secondary | ICD-10-CM

## 2022-09-23 ENCOUNTER — Ambulatory Visit
Admission: RE | Admit: 2022-09-23 | Discharge: 2022-09-23 | Disposition: A | Discharge status: Home / Self Care | Payer: BC Managed Care – PPO | Source: Ambulatory Visit | Attending: Obstetrics & Gynecology | Admitting: Obstetrics & Gynecology

## 2022-09-23 ENCOUNTER — Other Ambulatory Visit: Payer: Self-pay | Admitting: Obstetrics & Gynecology

## 2022-09-23 DIAGNOSIS — R928 Other abnormal and inconclusive findings on diagnostic imaging of breast: Secondary | ICD-10-CM

## 2023-01-09 ENCOUNTER — Encounter: Payer: Self-pay | Admitting: Physician Assistant

## 2023-01-09 ENCOUNTER — Ambulatory Visit: Payer: BC Managed Care – PPO | Admitting: Physician Assistant

## 2023-01-09 VITALS — BP 120/72 | HR 63 | Temp 98.0°F | Ht 68.5 in | Wt 163.0 lb

## 2023-01-09 DIAGNOSIS — R0981 Nasal congestion: Secondary | ICD-10-CM

## 2023-01-09 DIAGNOSIS — R635 Abnormal weight gain: Secondary | ICD-10-CM

## 2023-01-09 MED ORDER — PREDNISONE 20 MG PO TABS: 20.0000 mg | ORAL_TABLET | Freq: Two times a day (BID) | ORAL | 0 refills | Status: DC

## 2023-01-09 MED ORDER — AMOXICILLIN-POT CLAVULANATE 875-125 MG PO TABS: 1.0000 | ORAL_TABLET | Freq: Two times a day (BID) | ORAL | 0 refills | Status: DC

## 2023-01-09 MED ORDER — MONTELUKAST SODIUM 10 MG PO TABS: 10.0000 mg | ORAL_TABLET | Freq: Every day | ORAL | 1 refills | Status: DC

## 2023-01-09 NOTE — Patient Instructions (Addendum)
 It was great to see you!  Start medications as prescribed and follow-up if lack of improvement or any worsening  Take care,  Jarold Motto PA-C

## 2023-01-09 NOTE — Progress Notes (Signed)
 [name] is a 53 y.o. female here for a new problem.  History of Present Illness:   Chief Complaint  Patient presents with   Sinus Problem    Pt c/o headache, sinus pressure, cough and expectorating green, started on Monday.   Otalgia    Pt c/o right ear pain, started on Wed, has been doing OTC cold and sinus medicine.   Sinusitis Symptom(s) started about 5 days ago Worsening with time Taking Singulair  10 mg (takes only as needed)  Only thing really helping is Advil  Cold and Sinus, did 1 spray of afrin Facial congestion increasing Am cough Having right ear pressure Denies fevers, chills  Weight gain She is having trouble losing weight Exercising as able but not recently due to upper respiratory infection (URI) symptom(s)  Doing some intermittent fasting as able Sees Dr Duwaine Bl[facility] -- gets climara patch and oral progesterone which has helped vasomotor symptom(s)    Past Medical History:  Diagnosis Date   Allergy    Arthritis    osteo arthritist   GERD (gastroesophageal reflux disease)    OCC - uses Pepcid  PRN    Heart murmur    History of chicken pox    History of recurrent UTIs    PONV (postoperative nausea and vomiting)    Screening for HIV (human immunodeficiency virus)      Social History   Tobacco Use   Smoking status: Former    Current packs/day: 0.00    Types: Cigarettes    Quit date: 05/03/2003    Years since quitting: 19.7   Smokeless tobacco: Never  Vaping Use   Vaping status: Never Used  Substance Use Topics   Alcohol use: Yes    Comment: social   Drug use: No    Past Surgical History:  Procedure Laterality Date   BREAST LUMPECTOMY WITH RADIOACTIVE SEED LOCALIZATION Left 05/13/2013   Procedure: BREAST LUMPECTOMY WITH RADIOACTIVE SEED LOCALIZATION;  Surgeon: Elon CHRISTELLA Pacini, MD;  Location: [facility];  Service: General;  Laterality: Left;   BREAST SURGERY     breast augmentation 08/1998   DILATION  AND CURETTAGE OF UTERUS  02/2009   ENDOMETRIAL BIOPSY  08/1998   MANDIBLE FRACTURE SURGERY     1988   TONSILLECTOMY     1970    Family History  Problem Relation Age of Onset   Arthritis Mother    Depression Mother    Breast cancer Mother    Other Mother        pacemaker   Arthritis Father    COPD Father    Depression Father    Hypertension Father    Colon polyps Father    Prostate cancer Father    Miscarriages / Stillbirths Sister    Colon polyps Sister    Miscarriages / Stillbirths Sister    Lung cancer Maternal Grandfather    Heart attack Maternal Grandfather    Heart disease Maternal Grandfather    Hyperlipidemia Maternal Grandfather    Birth defects Paternal Grandfather        lung   Cancer Maternal Aunt        breast   Breast cancer Maternal Aunt    Cancer Paternal Aunt        breast   Breast cancer Paternal Aunt    Colon cancer Neg Hx    Esophageal cancer Neg Hx    Rectal cancer Neg Hx    Stomach cancer Neg Hx  Allergies  Allergen Reactions   Sulfa Antibiotics Hives    Current Medications:   Current Outpatient Medications:    amoxicillin -clavulanate (AUGMENTIN ) 875-125 MG tablet, Take 1 tablet by mouth 2 (two) times daily., Disp: 14 tablet, Rfl: 0   estradiol (CLIMARA - DOSED IN MG/24 HR) 0.05 mg/24hr patch, Place 0.05 mg onto the skin once a week., Disp: , Rfl:    fexofenadine (ALLEGRA) 180 MG tablet, Take 180 mg by mouth as needed., Disp: , Rfl:    fluticasone (FLONASE) 50 MCG/ACT nasal spray, fluticasone 250 mcg-salmeterol 50 mcg/dose blistr powdr for inhalation  Inhale 1 puff twice a day by inhalation route., Disp: , Rfl:    levocetirizine (XYZAL ALLERGY 24HR) 5 MG tablet, Take 5 mg by mouth every evening., Disp: , Rfl:    Multiple Vitamin (MULTIVITAMIN) tablet, Take 1 tablet by mouth daily., Disp: , Rfl:    predniSONE  (DELTASONE ) 20 MG tablet, Take 1 tablet (20 mg total) by mouth 2 (two) times daily with a meal., Disp: 10 tablet, Rfl: 0    progesterone (PROMETRIUM) 200 MG capsule, Take 200 mg by mouth daily., Disp: , Rfl:    triamcinolone  (KENALOG ) 0.025 % cream, Apply to affected area on face 1-2 times daily, Disp: 80 g, Rfl: 0   valACYclovir (VALTREX) 1000 MG tablet, Take 1,000 mg by mouth as needed., Disp: , Rfl:    montelukast  (SINGULAIR ) 10 MG tablet, Take 1 tablet (10 mg total) by mouth at bedtime., Disp: 90 tablet, Rfl: 1   Review of Systems:   Negative unless otherwise specified per HPI.  Vitals:   Vitals:   01/09/23 1141  BP: 120/72  Pulse: 63  Temp: 98 F (36.7 C)  TempSrc: Temporal  SpO2: 97%  Weight: 163 lb (73.9 kg)  Height: 5' 8.5 (1.74 m)     Body mass index is 24.42 kg/m.  Physical Exam:   Physical Exam Vitals and nursing note reviewed.  Constitutional:      General: She is not in acute distress.    Appearance: She is well-developed. She is not ill-appearing or toxic-appearing.  HENT:     Head: Normocephalic and atraumatic.     Right Ear: Ear canal and external ear normal. A middle ear effusion is present. Tympanic membrane is not erythematous, retracted or bulging.     Left Ear: Ear canal and external ear normal. A middle ear effusion is present. Tympanic membrane is not erythematous, retracted or bulging.     Nose:     Right Sinus: Maxillary sinus tenderness and frontal sinus tenderness present.     Left Sinus: Maxillary sinus tenderness and frontal sinus tenderness present.     Mouth/Throat:     Pharynx: Uvula midline. No posterior oropharyngeal erythema.  Eyes:     General: Lids are normal.     Conjunctiva/sclera: Conjunctivae normal.  Neck:     Trachea: Trachea normal.  Cardiovascular:     Rate and Rhythm: Normal rate and regular rhythm.     Heart sounds: Normal heart sounds, S1 normal and S2 normal.  Pulmonary:     Effort: Pulmonary effort is normal.     Breath sounds: Normal breath sounds. No decreased breath sounds, wheezing, rhonchi or rales.  Lymphadenopathy:     Cervical:  No cervical adenopathy.  Skin:    General: Skin is warm and dry.  Neurological:     Mental Status: She is alert.  Psychiatric:        Speech: Speech normal.  Behavior: Behavior normal. Behavior is cooperative.     Assessment and Plan:   1. Sinus congestion (Primary) No red flags on exam.   Will initiate Augmentin  and oral prednisone  per orders.  Discussed need to limit use of afrin to max 3 days Reviewed black box warning of singulair  and mood disruption Discussed taking medications as prescribed.  Reviewed return precautions including new or worsening fever, SOB, new or worsening cough or other concerns.  Push fluids and rest.  I recommend that patient follow-up if symptoms worsen or persist despite treatment x 7-10 days, sooner if needed.  2. Weight gain Reviewed basic nutrition recommendations including increasing protein and limiting simple carbohydrates Provided handout of meal planning ideas, balanced snack and breakfast ideas Continue to work on adequate strength training and aerobic activity  I spent a total of 26 minutes on this visit, today 01/09/23, which included reviewing nutritional information/handouts, discussing plan of care with patient and using shared-decision making on next steps, refilling medications, and documenting the findings in the note.   Lucie Buttner, PA-C

## 2023-03-17 ENCOUNTER — Other Ambulatory Visit (HOSPITAL_BASED_OUTPATIENT_CLINIC_OR_DEPARTMENT_OTHER): Payer: Self-pay

## 2023-03-17 ENCOUNTER — Emergency Department (HOSPITAL_BASED_OUTPATIENT_CLINIC_OR_DEPARTMENT_OTHER)
Admission: EM | Admit: 2023-03-17 | Discharge: 2023-03-17 | Disposition: A | Discharge status: Home / Self Care | Attending: Emergency Medicine | Admitting: Emergency Medicine

## 2023-03-17 ENCOUNTER — Other Ambulatory Visit: Payer: Self-pay

## 2023-03-17 ENCOUNTER — Encounter (HOSPITAL_BASED_OUTPATIENT_CLINIC_OR_DEPARTMENT_OTHER): Payer: Self-pay | Admitting: Emergency Medicine

## 2023-03-17 ENCOUNTER — Emergency Department (HOSPITAL_BASED_OUTPATIENT_CLINIC_OR_DEPARTMENT_OTHER): Admitting: Radiology

## 2023-03-17 DIAGNOSIS — R079 Chest pain, unspecified: Secondary | ICD-10-CM | POA: Diagnosis not present

## 2023-03-17 DIAGNOSIS — R42 Dizziness and giddiness: Secondary | ICD-10-CM | POA: Diagnosis not present

## 2023-03-17 DIAGNOSIS — Z87891 Personal history of nicotine dependence: Secondary | ICD-10-CM | POA: Insufficient documentation

## 2023-03-17 DIAGNOSIS — R0689 Other abnormalities of breathing: Secondary | ICD-10-CM | POA: Diagnosis not present

## 2023-03-17 DIAGNOSIS — R0789 Other chest pain: Secondary | ICD-10-CM | POA: Diagnosis not present

## 2023-03-17 DIAGNOSIS — R202 Paresthesia of skin: Secondary | ICD-10-CM | POA: Diagnosis not present

## 2023-03-17 DIAGNOSIS — R55 Syncope and collapse: Secondary | ICD-10-CM | POA: Insufficient documentation

## 2023-03-17 LAB — BASIC METABOLIC PANEL
Anion gap: 10 (ref 5–15)
BUN: 15 mg/dL (ref 6–20)
CO2: 23 mmol/L (ref 22–32)
Calcium: 9.8 mg/dL (ref 8.9–10.3)
Chloride: 103 mmol/L (ref 98–111)
Creatinine, Ser: 0.65 mg/dL (ref 0.44–1.00)
GFR, Estimated: >60 mL/min (ref >60)
Glucose, Bld: 113 mg/dL — ABNORMAL HIGH (ref 70–99)
Potassium: 3.7 mmol/L (ref 3.5–5.1)
Sodium: 136 mmol/L (ref 135–145)

## 2023-03-17 LAB — CBC WITH DIFFERENTIAL/PLATELET
Abs Immature Granulocytes: 0.01 10*3/uL (ref 0.00–0.07)
Basophils Absolute: 0 10*3/uL (ref 0.0–0.1)
Basophils Relative: 0 %
Eosinophils Absolute: 0.1 10*3/uL (ref 0.0–0.5)
Eosinophils Relative: 2 %
HCT: 39.7 % (ref 36.0–46.0)
Hemoglobin: 13.6 g/dL (ref 12.0–15.0)
Immature Granulocytes: 0 %
Lymphocytes Relative: 17 %
Lymphs Abs: 1.3 10*3/uL (ref 0.7–4.0)
MCH: 32 pg (ref 26.0–34.0)
MCHC: 34.3 g/dL (ref 30.0–36.0)
MCV: 93.4 fL (ref 80.0–100.0)
Monocytes Absolute: 0.6 10*3/uL (ref 0.1–1.0)
Monocytes Relative: 8 %
Neutro Abs: 5.5 10*3/uL (ref 1.7–7.7)
Neutrophils Relative %: 73 %
Platelets: 309 10*3/uL (ref 150–400)
RBC: 4.25 MIL/uL (ref 3.87–5.11)
RDW: 11.7 % (ref 11.5–15.5)
WBC: 7.5 10*3/uL (ref 4.0–10.5)
nRBC: 0 % (ref 0.0–0.2)

## 2023-03-17 LAB — CBG MONITORING, ED: Glucose-Capillary: 106 mg/dL — ABNORMAL HIGH (ref 70–99)

## 2023-03-17 LAB — HEPATIC FUNCTION PANEL
ALT: 11 U/L (ref 0–44)
AST: 14 U/L — ABNORMAL LOW (ref 15–41)
Albumin: 4.7 g/dL (ref 3.5–5.0)
Alkaline Phosphatase: 48 U/L (ref 38–126)
Bilirubin, Direct: 0.1 mg/dL (ref 0.0–0.2)
Indirect Bilirubin: 0.6 mg/dL (ref 0.3–0.9)
Total Bilirubin: 0.7 mg/dL (ref 0.0–1.2)
Total Protein: 7.1 g/dL (ref 6.5–8.1)

## 2023-03-17 LAB — LIPASE, BLOOD: Lipase: 21 U/L (ref 11–51)

## 2023-03-17 LAB — TROPONIN I (HIGH SENSITIVITY)
Troponin I (High Sensitivity): 3 ng/L (ref <18)
Troponin I (High Sensitivity): 11 ng/L (ref <18)

## 2023-03-17 LAB — D-DIMER, QUANTITATIVE: D-Dimer, Quant: 0.27 ug{FEU}/mL (ref 0.00–0.50)

## 2023-03-17 MED ORDER — HYDROXYZINE HCL 25 MG PO TABS
25.0000 mg | ORAL_TABLET | Freq: Four times a day (QID) | ORAL | 0 refills | Status: DC | PRN
  Filled 2023-03-17: qty 12, 3d supply, fill #0

## 2023-03-17 MED ORDER — LIDOCAINE VISCOUS HCL 2 % MT SOLN
15.0000 mL | Freq: Once | OROMUCOSAL | Status: AC
  Administered 2023-03-17: 15 mL via ORAL
  Filled 2023-03-17: qty 15

## 2023-03-17 MED ORDER — ALUM & MAG HYDROXIDE-SIMETH 200-200-20 MG/5ML PO SUSP
30.0000 mL | Freq: Once | ORAL | Status: AC
  Administered 2023-03-17: 30 mL via ORAL
  Filled 2023-03-17: qty 30

## 2023-03-17 MED ORDER — SODIUM CHLORIDE 0.9 % IV BOLUS
1000.0000 mL | Freq: Once | INTRAVENOUS | Status: AC
  Administered 2023-03-17: 1000 mL via INTRAVENOUS

## 2023-03-17 MED ORDER — LORAZEPAM 1 MG PO TABS
1.0000 mg | ORAL_TABLET | Freq: Two times a day (BID) | ORAL | 0 refills | Status: DC | PRN
  Filled 2023-03-17: qty 10, 5d supply, fill #0

## 2023-03-17 NOTE — ED Notes (Signed)
 Pt bib [facility], c/o Chest pressure that started at 1030 today with dizziness. PT was hyperventilating when EMS arrived.  104 HR 160/90  100% CBG 145 22 Resp

## 2023-03-17 NOTE — Discharge Instructions (Addendum)
 It was a pleasure caring for you today in the emergency department.  Be sure to get plenty of rest, drink plenty of fluids of the next few days.  Please follow-up with your PCP in the next week for recheck. Please consider gradually cutting back on your alcohol use.  You were prescribed ativan/lorazepam to help with your anxiety, please do not take this medication when drinking alcohol.   Please return to the emergency department for any worsening or worrisome symptoms.

## 2023-03-17 NOTE — ED Provider Notes (Signed)
 [place] EMERGENCY DEPARTMENT AT [facility] Provider Note  CSN: [id number] Arrival date & time: 03/17/23 1133  Chief Complaint(s) Chest Pain  HPI [name] is a 53 y.o. female with past medical history as below, significant for GERD, recurrent UTI, daily etoh who presents to the ED with complaint of chest pressure, near sycope  Patient reports episodes of past 24 hours of chest pressure, lightheadedness, near syncope.  First episode was yesterday, while she was working.  Symptoms resolved after few moments.  Midsternal chest pressure, nonradiating.  Symptoms resolved spontaneously.  She had repeat episode today, chest pressure, nonradiating, felt like she was going to have LOC.  EMS reports that she was hyperventilating, hands were shaking.  Felt very anxious.  She took aspirin.  Symptoms resolved.  She still feels "shaky" but has no chest discomfort, no dyspnea, no palpitations, no nausea or vomiting.  Denies similar symptoms in the past.  Does drink alcohol nightly, 2 to 3 glasses.  Patient reports that she was celebrating last night and drank more than her typical amount of alcohol.  Denies history of alcohol withdrawal in the past or hospitalization for alcohol associated circumstances.  No illicit drug use  Past Medical History Past Medical History:  Diagnosis Date   Allergy    Arthritis    osteo arthritist   GERD (gastroesophageal reflux disease)    OCC - uses Pepcid PRN    Heart murmur    History of chicken pox    History of recurrent UTIs    PONV (postoperative nausea and vomiting)    Screening for HIV (human immunodeficiency virus)    Patient Active Problem List   Diagnosis Date Noted   Abnormal cervical Papanicolaou smear 03/17/2019   Arthritis 03/17/2019   Abnormal mammogram 05/02/2013   Home Medication(s) Prior to Admission medications   Medication Sig Start Date End Date Taking? Authorizing Provider  LORazepam (ATIVAN) 1 MG tablet Take 1 tablet (1  mg total) by mouth 2 (two) times daily as needed for anxiety. 03/17/23  Yes Tanda Rockers A, DO  amoxicillin-clavulanate (AUGMENTIN) 875-125 MG tablet Take 1 tablet by mouth 2 (two) times daily. 01/09/23   Jarold Motto, PA  estradiol (CLIMARA - DOSED IN MG/24 HR) 0.05 mg/24hr patch Place 0.05 mg onto the skin once a week. 10/30/21   [provider]  fexofenadine (ALLEGRA) 180 MG tablet Take 180 mg by mouth as needed.    [provider]  fluticasone (FLONASE) 50 MCG/ACT nasal spray fluticasone 250 mcg-salmeterol 50 mcg/dose blistr powdr for inhalation  Inhale 1 puff twice a day by inhalation route.    [provider]  levocetirizine (XYZAL ALLERGY 24HR) 5 MG tablet Take 5 mg by mouth every evening.    [provider]  montelukast (SINGULAIR) 10 MG tablet Take 1 tablet (10 mg total) by mouth at bedtime. 01/09/23   Jarold Motto, PA  Multiple Vitamin (MULTIVITAMIN) tablet Take 1 tablet by mouth daily.    [provider]  predniSONE (DELTASONE) 20 MG tablet Take 1 tablet (20 mg total) by mouth 2 (two) times daily with a meal. 01/09/23   Jarold Motto, PA  progesterone (PROMETRIUM) 200 MG capsule Take 200 mg by mouth daily. 10/30/21   [provider]  triamcinolone (KENALOG) 0.025 % cream Apply to affected area on face 1-2 times daily 03/22/21   Jarold Motto, PA  valACYclovir (VALTREX) 1000 MG tablet Take 1,000 mg by mouth as needed. 10/27/19   [provider]  Past Surgical History Past Surgical History:  Procedure Laterality Date   BREAST LUMPECTOMY WITH RADIOACTIVE SEED LOCALIZATION Left 05/13/2013   Procedure: BREAST LUMPECTOMY WITH RADIOACTIVE SEED LOCALIZATION;  Surgeon: Ernestene Mention, MD;  Location: [facility];  Service: General;  Laterality: Left;   BREAST SURGERY     breast  augmentation 08/1998   DILATION AND CURETTAGE OF UTERUS  02/2009   ENDOMETRIAL BIOPSY  08/1998   MANDIBLE FRACTURE SURGERY     1988   TONSILLECTOMY     1970   Family History Family History  Problem Relation Age of Onset   Arthritis Mother    Depression Mother    Breast cancer Mother    Other Mother        pacemaker   Arthritis Father    COPD Father    Depression Father    Hypertension Father    Colon polyps Father    Prostate cancer Father    Miscarriages / [place] Sister    Colon polyps Sister    Miscarriages / Stillbirths Sister    Lung cancer Maternal Grandfather    Heart attack Maternal Grandfather    Heart disease Maternal Grandfather    Hyperlipidemia Maternal Grandfather    Birth defects Paternal Grandfather        lung   Cancer Maternal Aunt        breast   Breast cancer Maternal Aunt    Cancer Paternal Aunt        breast   Breast cancer Paternal Aunt    Colon cancer Neg Hx    Esophageal cancer Neg Hx    Rectal cancer Neg Hx    Stomach cancer Neg Hx     Social History Social History   Tobacco Use   Smoking status: Former    Current packs/day: 0.00    Types: Cigarettes    Quit date: 05/03/2003    Years since quitting: 19.8   Smokeless tobacco: Never  Vaping Use   Vaping status: Never Used  Substance Use Topics   Alcohol use: Yes    Comment: social   Drug use: No   Allergies Sulfa antibiotics  Review of Systems A thorough review of systems was obtained and all systems are negative except as noted in the HPI and PMH.   Physical Exam Vital Signs  I have reviewed the triage vital signs BP (!) 154/82   Pulse 89   Temp 98.2 F (36.8 C)   Resp 20   Wt 72.6 kg   LMP 05/27/2021 (Exact Date)   SpO2 100%   BMI 23.97 kg/m  Physical Exam Vitals and nursing note reviewed.  Constitutional:      General: She is not in acute distress.    Appearance: Normal appearance. She is well-developed.  HENT:     Head: Normocephalic and atraumatic.      Right Ear: External ear normal.     Left Ear: External ear normal.     Nose: Nose normal.     Mouth/Throat:     Mouth: Mucous membranes are moist.  Eyes:     General: No scleral icterus.       Right eye: No discharge.        Left eye: No discharge.  Cardiovascular:     Rate and Rhythm: Normal rate and regular rhythm.     Pulses: Normal pulses.     Heart sounds: Normal heart sounds.  Pulmonary:     Effort: Pulmonary effort is normal.  No respiratory distress.     Breath sounds: Normal breath sounds. No stridor.  Abdominal:     General: Abdomen is flat. There is no distension.     Palpations: Abdomen is soft.     Tenderness: There is no abdominal tenderness.  Musculoskeletal:     Cervical back: No rigidity.     Right lower leg: No edema.     Left lower leg: No edema.  Skin:    General: Skin is warm and dry.     Capillary Refill: Capillary refill takes less than 2 seconds.  Neurological:     Mental Status: She is alert.  Psychiatric:        Mood and Affect: Mood normal.        Behavior: Behavior normal. Behavior is cooperative.     ED Results and Treatments Labs (all labs ordered are listed, but only abnormal results are displayed) Labs Reviewed  BASIC METABOLIC PANEL - Abnormal; Notable for the following components:      Result Value   Glucose, Bld 113 (*)    All other components within normal limits  HEPATIC FUNCTION PANEL - Abnormal; Notable for the following components:   AST 14 (*)    All other components within normal limits  CBG MONITORING, ED - Abnormal; Notable for the following components:   Glucose-Capillary 106 (*)    All other components within normal limits  CBC WITH DIFFERENTIAL/PLATELET  D-DIMER, QUANTITATIVE  LIPASE, BLOOD  TROPONIN I (HIGH SENSITIVITY)  TROPONIN I (HIGH SENSITIVITY)                                                                                                                          Radiology DG Chest 2 View Result Date:  03/17/2023 CLINICAL DATA:  Chest pressure. EXAM: CHEST - 2 VIEW COMPARISON:  10/07/2014. FINDINGS: Bilateral lung fields are clear. Bilateral costophrenic angles are clear. Normal cardio-mediastinal silhouette. No acute osseous abnormalities. The soft tissues are within normal limits. IMPRESSION: No active cardiopulmonary disease. Electronically Signed   By: Jules Schick M.D.   On: 03/17/2023 14:37    Pertinent labs & imaging results that were available during my care of the patient were reviewed by me and considered in my medical decision making (see MDM for details).  Medications Ordered in ED Medications  sodium chloride 0.9 % bolus 1,000 mL (0 mLs Intravenous Stopped 03/17/23 1415)  alum & mag hydroxide-simeth (MAALOX/MYLANTA) 200-200-20 MG/5ML suspension 30 mL (30 mLs Oral Given 03/17/23 1236)    And  lidocaine (XYLOCAINE) 2 % viscous mouth solution 15 mL (15 mLs Oral Given 03/17/23 1236)  Procedures Procedures  (including critical care time)  Medical Decision Making / ED Course    Medical Decision Making:    Winry Egnew is a 53 y.o. female  with past medical history as below, significant for GERD, recurrent UTI, daily etoh who presents to the ED with complaint of chest pressure, near sycope. The complaint involves an extensive differential diagnosis and also carries with it a high risk of complications and morbidity.  Serious etiology was considered. Ddx includes but is not limited to: Differential includes all life-threatening causes for chest pain. This includes but is not exclusive to acute coronary syndrome, aortic dissection, pulmonary embolism, cardiac tamponade, community-acquired pneumonia, pericarditis, musculoskeletal chest wall pain, etc.   Complete initial physical exam performed, notably the patient was in no acute distress, resting  comfortably, no evidence of acute alcohol withdrawal.    Reviewed and confirmed nursing documentation for past medical history, family history, social history.  Vital signs reviewed.    Clinical Course as of 03/17/23 1520  Tue Mar 17, 2023  1229 D-Dimer, Quant: 0.27 PE unlikely [SG]    Clinical Course User Index [SG] Sloan Leiter, DO    Brief summary: 53 year old female with history as above here with near syncope, chest pressure.  The symptoms have resolved.  Labs reviewed, these are stable.  Troponin negative x 2, D-dimer is negative. She is feeling better after GI cocktail and fluids. Ambulatory w/ steady gait.  The patient's chest pressure is not suggestive of pulmonary embolus, cardiac ischemia, aortic dissection, pericarditis, myocarditis, pulmonary embolism, pneumothorax, pneumonia, Zoster, or esophageal perforation, or other serious etiology.  Historically not abrupt in onset, tearing or ripping, pulses symmetric. EKG nonspecific for ischemia/infarction. No dysrhythmias, brugada, WPW, prolonged QT noted.   Troponin negative x2. CXR reviewed. Labs without demonstration of acute pathology unless otherwise noted above. Low HEART Score: 0-3 points (0.9-1.7% risk of MACE).  Her pressure has resolved, anxiety may be component in her presentation today. Reports increased stress at work. Encouraged etoh reduction, f/u pcp, f/u outpatient counseling. May benefit from [facility] outpatient, will defer to pcp. Give short course of ativan for home as needed for anxiety, advised to not drink etoh when she takes the ativan.  The patient improved significantly and was discharged in stable condition. Detailed discussions were had with the patient/guardian regarding current findings, and need for close f/u with PCP or on call doctor. The patient/guardian has been instructed to return immediately if the symptoms worsen in any way for re-evaluation. Patient/guardian verbalized understanding and is in  agreement with current care plan. All questions answered prior to discharge.                  Additional history obtained: -Additional history obtained from family -External records from outside source obtained and reviewed including: Chart review including previous notes, labs, imaging, consultation notes including  Primary care documentation, home medications, PDMP   Lab Tests: -I ordered, reviewed, and interpreted labs.   The pertinent results include:   Labs Reviewed  BASIC METABOLIC PANEL - Abnormal; Notable for the following components:      Result Value   Glucose, Bld 113 (*)    All other components within normal limits  HEPATIC FUNCTION PANEL - Abnormal; Notable for the following components:   AST 14 (*)    All other components within normal limits  CBG MONITORING, ED - Abnormal; Notable for the following components:   Glucose-Capillary 106 (*)    All other components within normal limits  CBC WITH DIFFERENTIAL/PLATELET  D-DIMER, QUANTITATIVE  LIPASE, BLOOD  TROPONIN I (HIGH SENSITIVITY)  TROPONIN I (HIGH SENSITIVITY)    Notable for labs stable  EKG   EKG Interpretation Date/Time:  Tuesday March 17 2023 11:44:15 EDT Ventricular Rate:  103 PR Interval:  158 QRS Duration:  78 QT Interval:  334 QTC Calculation: 437 R Axis:   76  Text Interpretation: Sinus tachycardia Biatrial enlargement Abnormal ECG When compared with ECG of 07-Oct-2014 10:59, No significant change was found Confirmed by Tanda Rockers ([id number]) on 03/17/2023 11:52:53 AM         Imaging Studies ordered: I ordered imaging studies including cxr I independently visualized the following imaging with scope of interpretation limited to determining acute life threatening conditions related to emergency care; findings noted above I independently visualized and interpreted imaging. I agree with the radiologist interpretation   Medicines ordered and prescription drug management: Meds  ordered this encounter  Medications   sodium chloride 0.9 % bolus 1,000 mL   AND Linked Order Group    alum & mag hydroxide-simeth (MAALOX/MYLANTA) 200-200-20 MG/5ML suspension 30 mL    lidocaine (XYLOCAINE) 2 % viscous mouth solution 15 mL   DISCONTD: hydrOXYzine (ATARAX) 25 MG tablet    Sig: Take 1 tablet (25 mg total) by mouth every 6 (six) hours as needed for anxiety.    Dispense:  12 tablet    Refill:  0   LORazepam (ATIVAN) 1 MG tablet    Sig: Take 1 tablet (1 mg total) by mouth 2 (two) times daily as needed for anxiety.    Dispense:  10 tablet    Refill:  0    -I have reviewed the patients home medicines and have made adjustments as needed   Consultations Obtained: na   Cardiac Monitoring: The patient was maintained on a cardiac monitor.  I personally viewed and interpreted the cardiac monitored which showed an underlying rhythm of: sinus tachy > NSR Continuous pulse oximetry interpreted by myself, 100% on RA.    Social Determinants of Health:  Diagnosis or treatment significantly limited by social determinants of health: former smoker and alcohol use   Reevaluation: After the interventions noted above, I reevaluated the patient and found that they have resolved  Co morbidities that complicate the patient evaluation  Past Medical History:  Diagnosis Date   Allergy    Arthritis    osteo arthritist   GERD (gastroesophageal reflux disease)    OCC - uses Pepcid PRN    Heart murmur    History of chicken pox    History of recurrent UTIs    PONV (postoperative nausea and vomiting)    Screening for HIV (human immunodeficiency virus)       Dispostion: Disposition decision including need for hospitalization was considered, and patient discharged from emergency department.    Final Clinical Impression(s) / ED Diagnoses Final diagnoses:  Near syncope        Sloan Leiter, DO 03/17/23 1520

## 2023-03-17 NOTE — ED Triage Notes (Signed)
 Pt bib [facility], endorses chest pressure today when at work. Reports 2nd event that caused dizziness. Pressure has resolved. Chewed 2 x 325mg  aspirin pta.

## 2023-03-17 NOTE — ED Notes (Signed)
 Patient transported to X-ray

## 2023-03-23 ENCOUNTER — Ambulatory Visit: Admitting: Physician Assistant

## 2023-03-23 ENCOUNTER — Encounter: Payer: Self-pay | Admitting: Physician Assistant

## 2023-03-23 VITALS — BP 152/92 | HR 75 | Temp 98.1°F | Ht 68.5 in | Wt 163.0 lb

## 2023-03-23 DIAGNOSIS — R55 Syncope and collapse: Secondary | ICD-10-CM

## 2023-03-23 DIAGNOSIS — R030 Elevated blood-pressure reading, without diagnosis of hypertension: Secondary | ICD-10-CM

## 2023-03-23 DIAGNOSIS — N950 Postmenopausal bleeding: Secondary | ICD-10-CM

## 2023-03-23 DIAGNOSIS — H6993 Unspecified Eustachian tube disorder, bilateral: Secondary | ICD-10-CM | POA: Diagnosis not present

## 2023-03-23 MED ORDER — LORAZEPAM 0.5 MG PO TABS
0.2500 mg | ORAL_TABLET | Freq: Two times a day (BID) | ORAL | 1 refills | Status: DC | PRN
Start: 2023-03-23 — End: 2023-06-16

## 2023-03-23 MED ORDER — AZELASTINE HCL 0.1 % NA SOLN: 1.0000 | Freq: Two times a day (BID) | NASAL | 12 refills | Status: AC

## 2023-03-23 MED ORDER — AMLODIPINE BESYLATE 5 MG PO TABS: 5.0000 mg | ORAL_TABLET | Freq: Every day | ORAL | 1 refills | Status: DC

## 2023-03-23 NOTE — Progress Notes (Signed)
 [name] is a 53 y.o. female here for a hospital follow-up.  History of Present Illness:   Chief Complaint  Patient presents with   Follow-up    Pt was seen in the ED on 3/11 for Chest pain. Has been having heart burn, anxious, heart rate elevated.    HPI  Near syncope // ED follow-up: Pt presented to the ED on 3/11 complaining of episodes of chest pressure, lightheadedness, and near syncope.  Today, she reports eating out with friends the night prior to her near-syncopal episode.  She experienced right-sided chest discomfort while eating, at the time she believed it was heartburn.  Some tenderness when she would apply pressure to her chest.  She was eating [place] food and notes this was a heavier meal than usual.  Elevated BP the night of dinner 130s/90s. The next day, she experienced lightheadedness prior to her near syncopal episode while at work.  She had to sit down on the floor d/t lightheadedness.  HR was 146 bpm prior to EMS arrival - checked by her boss.  Her BP was 180s/100s - checked by [employer] prior to transport to ED.  Has continued to experience episodes intermittently since her ED visit.  She also reports episodes of vertigo and headaches a couple weeks ago.  Endorses a significant increase of work-related stress recently.  She is drinking at least 100 fl oz of water per day since her ED visit.  Has been limiting her caffeine intake per day.   Post-menopausal vagina bleeding: Pt reports vaginal bleeding and spotting for about 4-5 days 1 month ago. Previously had 2 years of no menstrual bleeding.  Describes it as a "light period", states she had to wear a liner.   Spotting was bright red in color.  Pt is currently established with Dr. Mitchel Honour of gynecology.  She has been using estradiol patch for about 2 years.  Denies any cramping.   Ear pain: Pt complains of ear pain and pressure as well as nasal congestion.  Reports having ear tubes as a  child.  Has been using generic Flonase to manage her sx, which has not helped much.   Past Medical History:  Diagnosis Date   Allergy    Arthritis    osteo arthritist   GERD (gastroesophageal reflux disease)    OCC - uses Pepcid PRN    Heart murmur    History of chicken pox    History of recurrent UTIs    PONV (postoperative nausea and vomiting)    Screening for HIV (human immunodeficiency virus)      Social History   Tobacco Use   Smoking status: Former    Current packs/day: 0.00    Types: Cigarettes    Quit date: 05/03/2003    Years since quitting: 19.9   Smokeless tobacco: Never  Vaping Use   Vaping status: Never Used  Substance Use Topics   Alcohol use: Yes    Comment: social   Drug use: No    Past Surgical History:  Procedure Laterality Date   BREAST LUMPECTOMY WITH RADIOACTIVE SEED LOCALIZATION Left 05/13/2013   Procedure: BREAST LUMPECTOMY WITH RADIOACTIVE SEED LOCALIZATION;  Surgeon: Ernestene Mention, MD;  Location: [facility];  Service: General;  Laterality: Left;   BREAST SURGERY     breast augmentation 08/1998   DILATION AND CURETTAGE OF UTERUS  02/2009   ENDOMETRIAL BIOPSY  08/1998   MANDIBLE FRACTURE SURGERY     1988   TONSILLECTOMY  1970    Family History  Problem Relation Age of Onset   Arthritis Mother    Depression Mother    Breast cancer Mother    Other Mother        pacemaker   Arthritis Father    COPD Father    Depression Father    Hypertension Father    Colon polyps Father    Prostate cancer Father    Miscarriages / [place] Sister    Colon polyps Sister    Miscarriages / Stillbirths Sister    Lung cancer Maternal Grandfather    Heart attack Maternal Grandfather    Heart disease Maternal Grandfather    Hyperlipidemia Maternal Grandfather    Birth defects Paternal Grandfather        lung   Cancer Maternal Aunt        breast   Breast cancer Maternal Aunt    Cancer Paternal Aunt        breast   Breast cancer  Paternal Aunt    Colon cancer Neg Hx    Esophageal cancer Neg Hx    Rectal cancer Neg Hx    Stomach cancer Neg Hx     Allergies  Allergen Reactions   Sulfa Antibiotics Hives    Current Medications:   Current Outpatient Medications:    amLODipine (NORVASC) 5 MG tablet, Take 1 tablet (5 mg total) by mouth daily., Disp: 30 tablet, Rfl: 1   azelastine (ASTELIN) 0.1 % nasal spray, Place 1 spray into both nostrils 2 (two) times daily. Use in each nostril as directed, Disp: 30 mL, Rfl: 12   estradiol (CLIMARA - DOSED IN MG/24 HR) 0.05 mg/24hr patch, Place 0.05 mg onto the skin once a week., Disp: , Rfl:    fexofenadine (ALLEGRA) 180 MG tablet, Take 180 mg by mouth as needed., Disp: , Rfl:    fluticasone (FLONASE) 50 MCG/ACT nasal spray, fluticasone 250 mcg-salmeterol 50 mcg/dose blistr powdr for inhalation  Inhale 1 puff twice a day by inhalation route., Disp: , Rfl:    levocetirizine (XYZAL ALLERGY 24HR) 5 MG tablet, Take 5 mg by mouth every evening., Disp: , Rfl:    LORazepam (ATIVAN) 0.5 MG tablet, Take 0.5-1 tablets (0.25-0.5 mg total) by mouth 2 (two) times daily as needed for anxiety., Disp: 30 tablet, Rfl: 1   montelukast (SINGULAIR) 10 MG tablet, Take 1 tablet (10 mg total) by mouth at bedtime., Disp: 90 tablet, Rfl: 1   Multiple Vitamin (MULTIVITAMIN) tablet, Take 1 tablet by mouth daily., Disp: , Rfl:    progesterone (PROMETRIUM) 200 MG capsule, Take 200 mg by mouth daily., Disp: , Rfl:    triamcinolone (KENALOG) 0.025 % cream, Apply to affected area on face 1-2 times daily, Disp: 80 g, Rfl: 0   valACYclovir (VALTREX) 1000 MG tablet, Take 1,000 mg by mouth as needed. (Patient not taking: Reported on 03/23/2023), Disp: , Rfl:    Review of Systems:   Negative unless otherwise specified per HPI.  Vitals:   Vitals:   03/23/23 1038 03/23/23 1110  BP: (!) 160/100 (!) 152/92  Pulse: 75   Temp: 98.1 F (36.7 C)   TempSrc: Temporal   SpO2: 98%   Weight: 163 lb (73.9 kg)   Height:  5' 8.5" (1.74 m)      Body mass index is 24.42 kg/m.  Physical Exam:   Physical Exam Vitals and nursing note reviewed.  Constitutional:      General: She is not in acute distress.  Appearance: She is well-developed. She is not ill-appearing or toxic-appearing.  HENT:     Right Ear: A middle ear effusion is present. There is no impacted cerumen. Tympanic membrane is scarred. Tympanic membrane is not perforated or erythematous.     Left Ear: A middle ear effusion is present. There is no impacted cerumen. Tympanic membrane is scarred. Tympanic membrane is not perforated or erythematous.  Cardiovascular:     Rate and Rhythm: Normal rate and regular rhythm.     Pulses: Normal pulses.     Heart sounds: Normal heart sounds, S1 normal and S2 normal.  Pulmonary:     Effort: Pulmonary effort is normal.     Breath sounds: Normal breath sounds.  Skin:    General: Skin is warm and dry.  Neurological:     Mental Status: She is alert.     GCS: GCS eye subscore is 4. GCS verbal subscore is 5. GCS motor subscore is 6.  Psychiatric:        Speech: Speech normal.        Behavior: Behavior normal. Behavior is cooperative.     Assessment and Plan:   PMB (postmenopausal bleeding) Discussed the need to see gynecology for this issue as this is considered abnormal after menopause - she plans to call Dr Lilyan Punt office to schedule  Elevated blood pressure reading; Near syncope Above goal today No evidence of end-organ damage on my exam Recommend patient monitor home blood pressure at least a few times weekly I discouraged further use of her estrogen patch vs discussion with gynecology for further use I also provided her a 5 mg dose of amlodipine - should her blood pressure remain elevated over the next few days on home readings, I would like for her to start this and follow up with [place] in a few weeks after doing so. We could consider cardiology referral vs heart monitor -- she has decided to hold  off on this for now If home monitoring shows consistent elevation, or any symptom(s) develop, recommend reach out to [place] for further advice on next steps Will also check TSH She does have anxiety component - we discussed reducing ativan dosage to see if this helps for her episodes where she feels increase in anxiety that may lead to panic attack symptom(s) - if requires this often, needs close follow up to discuss alternative medications  Dysfunction of both eustachian tubes No red flags on exam.   Recommend stopping Flonase and trial Astelin nasal spray Discussed taking medications as prescribed.  Consider ENT if persists I recommend that patient follow-up if symptoms worsen or persist despite treatment x 7-10 days, sooner if needed.    I, Isabelle Course, acting as a Neurosurgeon for Jarold Motto, [place]., have documented all relevant documentation on the behalf of Jarold Motto, [place], as directed by  Jarold Motto, PA while in the presence of Jarold Motto, [place].  I, Jarold Motto, [place], have reviewed all documentation for this visit. The documentation on 03/23/23 for the exam, diagnosis, procedures, and orders are all accurate and complete.  I spent a total of 43 minutes on this visit, today 03/23/23, which included reviewing previous notes from ED, ordering tests, discussing plan of care with patient and using shared-decision making on next steps, refilling medications, and documenting the findings in the note.  Jarold Motto, PA-C

## 2023-03-23 NOTE — Patient Instructions (Signed)
 It was great to see you!  Follow up with Dr Langston Masker about your bleeding and newly elevated blood pressure Hold patch for now  Trial new nasal spray  Your blood pressure is elevated in our office today.  I recommend that you monitor this at home.  Your goal blood pressure should be around < 130/80, unless you are over 54 years old, your goal may be closer to 140-150/90. Please note if you have been given other goals from a cardiologist or other healthcare provider, please defer to their recommendations.  If your blood pressure is consistently > 130/80 --> start the amlodipine 5 mg daily that I have sent in and please follow up with me in office in 2-4 weeks  When preparing to take your blood pressure: Plan ahead. Don't smoke, drink caffeine or exercise within 30 minutes before taking your blood pressure. Empty your bladder. Don't take the measurement over clothes. Remove the clothing over the arm that will be used to measure blood pressure. You can use either arm unless otherwise told by a healthcare provider. Usually there is not a big difference between readings on them. Be still. Allow at least five minutes of quiet rest before measurements. Don't talk or use the phone. Sit correctly. Sit with your back straight and supported (on a dining chair, rather than a sofa). Your feet should be flat on the floor. Do not cross your legs. Support your arm on a flat surface. The middle of the cuff should be placed on the upper arm at heart level.  Measure at the same time of the day. Take multiple readings and record the results. Each time you measure, take two readings one minute apart. Record the results and bring in to your next office visit.  In order to know how well the medication is working, I would like you to take your readings 1-2 hours after taking your blood pressure medication if possible. Take your blood pressure measurements and record 2-3 days per week.  If you get a high blood  pressure reading: A single high reading is not an immediate cause for alarm. If you get a reading that is higher than normal, take your blood pressure a second time. Write down the results of both measurements. Check with your health care professional to see if there's a health concern or whether there may be problems with your monitor. If your blood pressure readings are suddenly higher than 180/120 mm Hg, wait at least one minute and test again. If your readings are still very high, contact your health care professional immediately. You could be having a hypertensive crisis. Call 911 if your blood pressure is higher than 180/120 mm Hg and if you are having new signs or symptoms that may include: Chest pain Shortness of breath Back pain Numbness Weakness Change in vision Difficulty speaking Confusion Dizziness Vomiting    Take care,  Jarold Motto PA-C

## 2023-03-24 ENCOUNTER — Other Ambulatory Visit: Payer: Self-pay

## 2023-03-24 ENCOUNTER — Emergency Department (HOSPITAL_COMMUNITY)

## 2023-03-24 ENCOUNTER — Emergency Department (HOSPITAL_COMMUNITY)
Admission: EM | Admit: 2023-03-24 | Discharge: 2023-03-24 | Disposition: A | Discharge status: Home / Self Care | Attending: Emergency Medicine | Admitting: Emergency Medicine

## 2023-03-24 ENCOUNTER — Encounter (HOSPITAL_COMMUNITY): Payer: Self-pay

## 2023-03-24 DIAGNOSIS — R0789 Other chest pain: Secondary | ICD-10-CM | POA: Diagnosis not present

## 2023-03-24 DIAGNOSIS — R002 Palpitations: Secondary | ICD-10-CM | POA: Insufficient documentation

## 2023-03-24 DIAGNOSIS — R000 Tachycardia, unspecified: Secondary | ICD-10-CM | POA: Insufficient documentation

## 2023-03-24 DIAGNOSIS — R42 Dizziness and giddiness: Secondary | ICD-10-CM | POA: Insufficient documentation

## 2023-03-24 DIAGNOSIS — I10 Essential (primary) hypertension: Secondary | ICD-10-CM | POA: Diagnosis not present

## 2023-03-24 DIAGNOSIS — R079 Chest pain, unspecified: Secondary | ICD-10-CM | POA: Diagnosis not present

## 2023-03-24 LAB — BASIC METABOLIC PANEL
Anion gap: 6 (ref 5–15)
BUN: 14 mg/dL (ref 6–20)
CO2: 24 mmol/L (ref 22–32)
Calcium: 9.3 mg/dL (ref 8.9–10.3)
Chloride: 104 mmol/L (ref 98–111)
Creatinine, Ser: 0.72 mg/dL (ref 0.44–1.00)
GFR, Estimated: >60 mL/min (ref >60)
Glucose, Bld: 153 mg/dL — ABNORMAL HIGH (ref 70–99)
Potassium: 3.9 mmol/L (ref 3.5–5.1)
Sodium: 134 mmol/L — ABNORMAL LOW (ref 135–145)

## 2023-03-24 LAB — CBC
HCT: 41.7 % (ref 36.0–46.0)
Hemoglobin: 14.2 g/dL (ref 12.0–15.0)
MCH: 32.4 pg (ref 26.0–34.0)
MCHC: 34.1 g/dL (ref 30.0–36.0)
MCV: 95.2 fL (ref 80.0–100.0)
Platelets: 348 10*3/uL (ref 150–400)
RBC: 4.38 MIL/uL (ref 3.87–5.11)
RDW: 11.4 % — ABNORMAL LOW (ref 11.5–15.5)
WBC: 6 10*3/uL (ref 4.0–10.5)
nRBC: 0 % (ref 0.0–0.2)

## 2023-03-24 LAB — URINALYSIS, ROUTINE W REFLEX MICROSCOPIC
Bilirubin Urine: NEGATIVE
Glucose, UA: NEGATIVE mg/dL
Hgb urine dipstick: NEGATIVE
Ketones, ur: 20 mg/dL — AB
Leukocytes,Ua: NEGATIVE
Nitrite: NEGATIVE
Protein, ur: NEGATIVE mg/dL
Specific Gravity, Urine: 1.009 (ref 1.005–1.030)
pH: 6 (ref 5.0–8.0)

## 2023-03-24 LAB — T4, FREE: Free T4: 0.92 ng/dL (ref 0.61–1.12)

## 2023-03-24 LAB — TROPONIN I (HIGH SENSITIVITY)
Troponin I (High Sensitivity): <2 ng/L (ref <18)
Troponin I (High Sensitivity): 3 ng/L (ref <18)

## 2023-03-24 LAB — TSH: TSH: 1.832 u[IU]/mL (ref 0.350–4.500)

## 2023-03-24 LAB — D-DIMER, QUANTITATIVE: D-Dimer, Quant: <0.27 ug{FEU}/mL (ref 0.00–0.50)

## 2023-03-24 MED ORDER — LORAZEPAM 0.5 MG PO TABS
0.5000 mg | ORAL_TABLET | Freq: Once | ORAL | Status: AC
  Administered 2023-03-24: 0.5 mg via ORAL
  Filled 2023-03-24: qty 1

## 2023-03-24 NOTE — ED Provider Notes (Signed)
 [facility] Provider Note   CSN: [id number] Arrival date & time: 03/24/23  1227     History {Add pertinent medical, surgical, social history, OB history to HPI:1} Chief Complaint  Patient presents with   Tachycardia   Hypertension    [name] is a 53 y.o. female.  HPI     53yo female with history of GERD, postmenopausal bleeding for which she will see GYN, prior ED visit for chest pain, presents with concern for elevated blood pressure, tachycardia, lightheadedness.   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 Took kids to the restroom and felt it again. The pain came back again and sat on the floor. HR on watch was 146, BP 180/100, then shaking all over trying to take deep breaths, EMT hooked her up to ECG and heart monitor and looked ok but took her to the ED at [facility]. Did CXR, bloodwork which looked ok-given ativan, maalox with lidocaine, IV fulids, recheck troponin. Saw PCP yesterday and had estradiol pathcy on -recommend she take it off. Gave her BP meds but said to wait and see what numbers were. Then able to GYN NP agreed to remove estradiol .  Slept ok last night, this morning began to feel lightheaded, took deep breaths, sat down, took BP and was 164. Did walk around building yesterday, up a flight of stairs without  Off of estrogen as of yesterday  Maternal grandmother passed away of heart disease in 44s Mom has pacemaker in later 51s No smoking, no other drugs, no sig etoh    Past Medical History:  Diagnosis Date   Allergy    Arthritis    osteo arthritist   GERD (gastroesophageal reflux disease)    OCC - uses Pepcid PRN    Heart murmur    History of chicken pox    History of recurrent UTIs    PONV (postoperative nausea and vomiting)    Screening  for HIV (human immunodeficiency virus)      Home Medications Prior to Admission medications   Medication Sig Start Date End Date Taking? Authorizing Provider  amLODipine (NORVASC) 5 MG tablet Take 1 tablet (5 mg total) by mouth daily. 03/23/23   Jarold Motto, PA  azelastine (ASTELIN) 0.1 % nasal spray Place 1 spray into both nostrils 2 (two) times daily. Use in each nostril as directed 03/23/23   Jarold Motto, PA  estradiol (CLIMARA - DOSED IN MG/24 HR) 0.05 mg/24hr patch Place 0.05 mg onto the skin once a week. 10/30/21   [provider]  fexofenadine (ALLEGRA) 180 MG tablet Take 180 mg by mouth as needed.    [provider]  fluticasone (FLONASE) 50 MCG/ACT nasal spray fluticasone 250 mcg-salmeterol 50 mcg/dose blistr powdr for inhalation  Inhale 1 puff twice a day by inhalation route.    [provider]  levocetirizine (XYZAL ALLERGY 24HR) 5 MG tablet Take 5 mg by mouth every evening.    [provider]  LORazepam (ATIVAN) 0.5 MG tablet Take 0.5-1 tablets (0.25-0.5 mg total) by mouth 2 (two) times daily as needed for anxiety. 03/23/23   Jarold Motto, PA  montelukast (SINGULAIR) 10 MG tablet Take 1 tablet (10 mg total) by mouth at bedtime. 01/09/23   Jarold Motto, PA  Multiple Vitamin (MULTIVITAMIN) tablet Take 1 tablet by  mouth daily.    [provider]  progesterone (PROMETRIUM) 200 MG capsule Take 200 mg by mouth daily. 10/30/21   [provider]  triamcinolone (KENALOG) 0.025 % cream Apply to affected area on face 1-2 times daily 03/22/21   Jarold Motto, PA  valACYclovir (VALTREX) 1000 MG tablet Take 1,000 mg by mouth as needed. Patient not taking: Reported on 03/23/2023 10/27/19   [provider]      Allergies    Sulfa antibiotics    Review of Systems   Review of Systems  Physical Exam Updated Vital Signs BP (!) 156/83   Pulse 64   Temp 98.2 F (36.8 C)   Resp 18   Ht 5' 8.5" (1.74 m)   Wt 73.9 kg    LMP 05/27/2021 (Exact Date)   SpO2 100%   BMI 24.41 kg/m  Physical Exam  ED Results / Procedures / Treatments   Labs (all labs ordered are listed, but only abnormal results are displayed) Labs Reviewed  BASIC METABOLIC PANEL - Abnormal; Notable for the following components:      Result Value   Sodium 134 (*)    Glucose, Bld 153 (*)    All other components within normal limits  CBC - Abnormal; Notable for the following components:   RDW 11.4 (*)    All other components within normal limits  URINALYSIS, ROUTINE W REFLEX MICROSCOPIC - Abnormal; Notable for the following components:   Color, Urine STRAW (*)    Ketones, ur 20 (*)    All other components within normal limits  D-DIMER, QUANTITATIVE (NOT AT [facility])  TSH  T4, FREE  TROPONIN I (HIGH SENSITIVITY)  TROPONIN I (HIGH SENSITIVITY)    EKG EKG Interpretation Date/Time:  Tuesday March 24 2023 13:09:56 EDT Ventricular Rate:  80 PR Interval:  150 QRS Duration:  78 QT Interval:  368 QTC Calculation: 424 R Axis:   71  Text Interpretation: Normal sinus rhythm Biatrial enlargement Cannot rule out Anterior infarct , age undetermined Abnormal ECG When compared with ECG of 17-Mar-2023 11:44, No significant change since last tracing Confirmed by Alvira Monday ([id number]) on 03/24/2023 7:11:55 PM  Radiology DG Chest 2 View Result Date: 03/24/2023 CLINICAL DATA:  Hypertension, chest discomfort. EXAM: CHEST - 2 VIEW COMPARISON:  March 17, 2023. FINDINGS: The heart size and mediastinal contours are within normal limits. Both lungs are clear. The visualized skeletal structures are unremarkable. IMPRESSION: No active cardiopulmonary disease. Electronically Signed   By: Lupita Raider M.D.   On: 03/24/2023 15:19    Procedures Procedures  {Document cardiac monitor, telemetry assessment procedure when appropriate:1}  Medications Ordered in ED Medications  LORazepam (ATIVAN) tablet 0.5 mg (0.5 mg Oral Given 03/24/23 1313)    ED  Course/ Medical Decision Making/ A&P   {   Click here for ABCD2, HEART and other calculatorsREFRESH Note before signing :1}                               52yo female with history of GERD, postmenopausal bleeding for which she will see GYN, prior ED visit for chest pain, presents with concern for elevated blood pressure, tachycardia, lightheadedness.  EKG completed and personally about interpreted by me shows a normal sinus rhythm without acute ST changes.  Chest x-ray evaluated by me and shows no acute abnormalities, no signs of pneumonia, pulmonary edema or other abnormalities  Labs completed and personally about interpreted by me show  normal troponin, no anemia, no leukocytosis, no clinically significant electrolyte abnormalities.  D-dimer is negative and have low suspicion for PE.  Urinalysis shows no signs of urinary tract infection. {Document critical care time when appropriate:1} {Document review of labs and clinical decision tools ie heart score, Chads2Vasc2 etc:1}  {Document your independent review of radiology images, and any outside records:1} {Document your discussion with family members, caretakers, and with consultants:1} {Document social determinants of health affecting pt's care:1} {Document your decision making why or why not admission, treatments were needed:1} Final Clinical Impression(s) / ED Diagnoses Final diagnoses:  None    Rx / DC Orders ED Discharge Orders     None

## 2023-03-24 NOTE — ED Provider Triage Note (Signed)
 Emergency Medicine Provider Triage Evaluation Note  [name] , a 53 y.o. female  was evaluated in triage.  Pt complains of Episodes of high HR, BP, dizziness. No Hx of HTN or anxiety. On estrogen, Fhx blood clots  Review of Systems  Positive: Epigastric/right chest pain pain, palpitations, dizziness, intermittent lightheadedness, intermittent HA, bilateral flank pain Negative: Fever, chills, N/V/D, No calf tenderness or swelling.  Physical Exam  BP (!) 175/91   Pulse (!) 107   Temp 97.6 F (36.4 C)   Resp 13   Ht 5' 8.5" (1.74 m)   Wt 73.9 kg   LMP 05/27/2021 (Exact Date)   SpO2 100%   BMI 24.41 kg/m  Gen:   Awake, no distress   Resp:  Normal effort  MSK:   Moves extremities without difficulty  Other:    Medical Decision Making  Medically screening exam initiated at 12:39 PM.  Appropriate orders placed.  Mitali Shenefield was informed that the remainder of the evaluation will be completed by another provider, this initial triage assessment does not replace that evaluation, and the importance of remaining in the ED until their evaluation is complete.  Labs and imaging ordered   Gretta Began 03/24/23 1247

## 2023-03-24 NOTE — ED Triage Notes (Signed)
 Pt states she had bp 164/119 at work. 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 Pt states she has been tachycardic 126. Pt states last week hr was 146. Pt states she is starting to shake. Pt denies chest pain. Pt states she feels lightheaded and has had mild HA since last week.

## 2023-04-02 DIAGNOSIS — N950 Postmenopausal bleeding: Secondary | ICD-10-CM | POA: Diagnosis not present

## 2023-04-14 ENCOUNTER — Other Ambulatory Visit: Payer: Self-pay | Admitting: Physician Assistant

## 2023-04-15 ENCOUNTER — Ambulatory Visit: Admitting: Physician Assistant

## 2023-04-15 ENCOUNTER — Encounter: Payer: Self-pay | Admitting: Physician Assistant

## 2023-04-15 VITALS — BP 126/80 | HR 76 | Temp 98.1°F | Ht 68.5 in | Wt 161.0 lb

## 2023-04-15 DIAGNOSIS — R030 Elevated blood-pressure reading, without diagnosis of hypertension: Secondary | ICD-10-CM | POA: Diagnosis not present

## 2023-04-15 DIAGNOSIS — N959 Unspecified menopausal and perimenopausal disorder: Secondary | ICD-10-CM | POA: Diagnosis not present

## 2023-04-15 MED ORDER — TRAZODONE HCL 50 MG PO TABS: 25.0000 mg | ORAL_TABLET | Freq: Every evening | ORAL | 1 refills | Status: DC | PRN

## 2023-04-15 NOTE — Progress Notes (Signed)
 [name] is a 53 y.o. female here for a hospital follow up.  History of Present Illness:   Chief Complaint  Patient presents with   Follow-up    Pt was seen in the ED on 3/18 for elevated blood pressure, started on medication Amlodipine 5 mg.    HPI  ED Follow up -- Elevated blood pressures: Pt presented to the ED on 3/18 complaining of lightheadedness and mild headaches with elevated BP and HR (per her watch) while at work.  EKG, chest x-ray, and labs were all found to be unremarkable.  She was recommended to follow up with cardiology prior to discharge.   Today, she is feeling well overall.  Started Amlodipine 5 mg once daily on 03/24/23.  Has been monitoring her blood pressures at home.  Reports readings in the 120s-140s/80s She states it was believed her elevated blood pressures and HR are stress induced.  She has cut back to 1 cup of coffee in the mornings.  She is scheduled to see cardiology on 6/4.    Anxiety // Insomnia: She also takes Ativan to help with stress/anxiety while at work.  She endorses episodes of "acid reflux without the burning".  She has been taking OTC Pepcid, but hasn't taken it in a few days.  Pt has been taking Ativan a few nights a week to help improve her sleep quality.  She notes she took 2 tabs about 2 nights ago and slept 6-7 hours.  She reports occasional episodes of a "blip" and she "freezes" when she turns her head too quickly.  She would like to hold on resuming estrogen patch until she is able to follow up with cardiology.  Took her estradiol patch off on 3/18.     Past Medical History:  Diagnosis Date   Allergy    Arthritis    osteo arthritist   GERD (gastroesophageal reflux disease)    OCC - uses Pepcid PRN    Heart murmur    History of chicken pox    History of recurrent UTIs    PONV (postoperative nausea and vomiting)    Screening for HIV (human immunodeficiency virus)      Social History   Tobacco Use   Smoking  status: Former    Current packs/day: 0.00    Types: Cigarettes    Quit date: 05/03/2003    Years since quitting: 19.9   Smokeless tobacco: Never  Vaping Use   Vaping status: Never Used  Substance Use Topics   Alcohol use: Yes    Comment: social   Drug use: No    Past Surgical History:  Procedure Laterality Date   BREAST LUMPECTOMY WITH RADIOACTIVE SEED LOCALIZATION Left 05/13/2013   Procedure: BREAST LUMPECTOMY WITH RADIOACTIVE SEED LOCALIZATION;  Surgeon: Ernestene Mention, MD;  Location: [facility];  Service: General;  Laterality: Left;   BREAST SURGERY     breast augmentation 08/1998   DILATION AND CURETTAGE OF UTERUS  02/2009   ENDOMETRIAL BIOPSY  08/1998   MANDIBLE FRACTURE SURGERY     1988   TONSILLECTOMY     1970    Family History  Problem Relation Age of Onset   Arthritis Mother    Depression Mother    Breast cancer Mother    Other Mother        pacemaker   Arthritis Father    COPD Father    Depression Father    Hypertension Father    Colon polyps Father  Prostate cancer Father    Miscarriages / [place] Sister    Colon polyps Sister    Miscarriages / Stillbirths Sister    Lung cancer Maternal Grandfather    Heart attack Maternal Grandfather    Heart disease Maternal Grandfather    Hyperlipidemia Maternal Grandfather    Birth defects Paternal Grandfather        lung   Cancer Maternal Aunt        breast   Breast cancer Maternal Aunt    Cancer Paternal Aunt        breast   Breast cancer Paternal Aunt    Colon cancer Neg Hx    Esophageal cancer Neg Hx    Rectal cancer Neg Hx    Stomach cancer Neg Hx     Allergies  Allergen Reactions   Sulfa Antibiotics Hives    Current Medications:   Current Outpatient Medications:    amLODipine (NORVASC) 5 MG tablet, TAKE 1 TABLET (5 MG TOTAL) BY MOUTH DAILY., Disp: 90 tablet, Rfl: 1   azelastine (ASTELIN) 0.1 % nasal spray, Place 1 spray into both nostrils 2 (two) times daily. Use in each  nostril as directed, Disp: 30 mL, Rfl: 12   fexofenadine (ALLEGRA) 180 MG tablet, Take 180 mg by mouth as needed., Disp: , Rfl:    levocetirizine (XYZAL ALLERGY 24HR) 5 MG tablet, Take 5 mg by mouth every evening., Disp: , Rfl:    LORazepam (ATIVAN) 0.5 MG tablet, Take 0.5-1 tablets (0.25-0.5 mg total) by mouth 2 (two) times daily as needed for anxiety., Disp: 30 tablet, Rfl: 1   Multiple Vitamin (MULTIVITAMIN) tablet, Take 1 tablet by mouth daily., Disp: , Rfl:    progesterone (PROMETRIUM) 200 MG capsule, Take 200 mg by mouth daily., Disp: , Rfl:    traZODone (DESYREL) 50 MG tablet, Take 0.5-1 tablets (25-50 mg total) by mouth at bedtime as needed for sleep., Disp: 30 tablet, Rfl: 1   triamcinolone (KENALOG) 0.025 % cream, Apply to affected area on face 1-2 times daily, Disp: 80 g, Rfl: 0   valACYclovir (VALTREX) 1000 MG tablet, Take 1,000 mg by mouth as needed., Disp: , Rfl:    Review of Systems:   Negative unless otherwise specified per HPI.  Vitals:   Vitals:   04/15/23 1431  BP: 126/80  Pulse: 76  Temp: 98.1 F (36.7 C)  TempSrc: Temporal  SpO2: 98%  Weight: 161 lb (73 kg)  Height: 5' 8.5" (1.74 m)     Body mass index is 24.12 kg/m.  Physical Exam:   Physical Exam Vitals and nursing note reviewed.  Constitutional:      General: She is not in acute distress.    Appearance: She is well-developed. She is not ill-appearing or toxic-appearing.  Cardiovascular:     Rate and Rhythm: Normal rate and regular rhythm.     Pulses: Normal pulses.     Heart sounds: Normal heart sounds, S1 normal and S2 normal.  Pulmonary:     Effort: Pulmonary effort is normal.     Breath sounds: Normal breath sounds.  Skin:    General: Skin is warm and dry.  Neurological:     Mental Status: She is alert.     GCS: GCS eye subscore is 4. GCS verbal subscore is 5. GCS motor subscore is 6.  Psychiatric:        Speech: Speech normal.        Behavior: Behavior normal. Behavior is cooperative.      Assessment  and Plan:   1. Elevated blood pressure reading (Primary) Normotensive Continue amlodipine 5 mg daily Follow up with cardiology for further evaluation of this Recommend continued intake of acid reducer (Pepcid) If uncontrolled blood pressure at home, recommend close follow up with [place]  2. Menopausal disorder Ongoing Hopefully we can resume hormone replacement therapy after she sees cardiology Start 25 mg trazodone daily - reach out with results Hold progesterone for the time being Recommend holding off on any OTC (available over the counter without a prescription) supplements  I, Isabelle Course, acting as a Neurosurgeon for Jarold Motto, [place]., have documented all relevant documentation on the behalf of Jarold Motto, [place], as directed by  Jarold Motto, PA while in the presence of Jarold Motto, [place].  I, Jarold Motto, [place], have reviewed all documentation for this visit. The documentation on 04/15/23 for the exam, diagnosis, procedures, and orders are all accurate and complete.  Jarold Motto, PA-C

## 2023-04-15 NOTE — Patient Instructions (Addendum)
 It was great to see you!  BP great -- keep taking amlodipine 5 mg daily!! Continue daily acid-reducer Stop progesterone for the time being Start 25 mg (half dose of trazodone) for sleep -- take 30 minutes prior to bed - let me know how this does for you!  If you do not hear anything from cardiology by Monday -- message me!!   Take care,  Jarold Motto PA-C

## 2023-04-21 ENCOUNTER — Encounter: Payer: Self-pay | Admitting: Physician Assistant

## 2023-04-21 NOTE — Telephone Encounter (Signed)
 Hi there, are there any sooner new pt appointments?

## 2023-05-10 ENCOUNTER — Other Ambulatory Visit: Payer: Self-pay | Admitting: Physician Assistant

## 2023-05-19 ENCOUNTER — Ambulatory Visit: Attending: Cardiovascular Disease | Admitting: Cardiovascular Disease

## 2023-05-19 ENCOUNTER — Encounter: Payer: Self-pay | Admitting: Cardiovascular Disease

## 2023-05-19 VITALS — BP 124/82 | HR 78 | Ht 68.5 in | Wt 163.6 lb

## 2023-05-19 DIAGNOSIS — R002 Palpitations: Secondary | ICD-10-CM

## 2023-05-19 DIAGNOSIS — I10 Essential (primary) hypertension: Secondary | ICD-10-CM | POA: Diagnosis not present

## 2023-05-19 DIAGNOSIS — R42 Dizziness and giddiness: Secondary | ICD-10-CM

## 2023-05-19 DIAGNOSIS — Z79899 Other long term (current) drug therapy: Secondary | ICD-10-CM | POA: Diagnosis not present

## 2023-05-19 MED ORDER — LOSARTAN POTASSIUM 50 MG PO TABS: 50.0000 mg | ORAL_TABLET | Freq: Every day | ORAL | 3 refills | Status: AC

## 2023-05-19 NOTE — Patient Instructions (Signed)
 Medication Instructions:  STOP Amlodipine  START Losartan 50mg  daily *If you need a refill on your cardiac medications before your next appointment, please call your pharmacy*  Lab Work: BMET week prior to appt If you have labs (blood work) drawn today and your tests are completely normal, you will receive your results only by: MyChart Message (if you have MyChart) OR A paper copy in the mail If you have any lab test that is abnormal or we need to change your treatment, we will call you to review the results.  Testing/Procedures: ECHO Your physician has requested that you have an echocardiogram. Echocardiography is a painless test that uses sound waves to create images of your heart. It provides your doctor with information about the size and shape of your heart and how well your heart's chambers and valves are working. This procedure takes approximately one hour. There are no restrictions for this procedure. Please do NOT wear cologne, perfume, aftershave, or lotions (deodorant is allowed). Please arrive 15 minutes prior to your appointment time.  Please note: We ask at that you not bring children with you during ultrasound (echo/ vascular) testing. Due to room size and safety concerns, children are not allowed in the ultrasound rooms during exams. Our front office staff cannot provide observation of children in our lobby area while testing is being conducted. An adult accompanying a patient to their appointment will only be allowed in the ultrasound room at the discretion of the ultrasound technician under special circumstances. We apologize for any inconvenience.  Follow-Up: At [facility], you and your health needs are our priority.  As part of our continuing mission to provide you with exceptional heart care, our providers are all part of one team.  This team includes your primary Cardiologist (physician) and Advanced Practice Providers or APPs (Physician Assistants and Nurse  Practitioners) who all work together to provide you with the care you need, when you need it.  Your next appointment:   As Scheduled  Provider:   Ahmad Alert, MD

## 2023-05-19 NOTE — Progress Notes (Signed)
  Cardiology Office Note:  .   Date:  05/19/2023  ID:  [name], DOB 03/14/1970, MRN [id number] PCP: Alexander Iba, PA  [facility] Providers Cardiologist:  None    History of Present Illness: .   [name] is a 53 y.o. female with HTN  She is a Runner, broadcasting/film/video,  felt very dizzy one day in class Was very tachy ( 140)  Hypertensive  BP eventually came down, treated with Ativan    She has stopped her estrogen and progestogen .  Was started on amlodipine  in march after a 2nd ER visit   She feels like her HR is racing but the rate is well controlled.   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 GM - CHF Mother pacer, HTN Father  - COPD, cancer    ROS:   Studies Reviewed: .         Risk Assessment/Calculations:             Physical Exam:   VS:  BP 124/82   Pulse 78   Ht 5' 8.5" (1.74 m)   Wt 163 lb 9.6 oz (74.2 kg)   LMP 05/27/2021 (Exact Date)   SpO2 99%   BMI 24.51 kg/m    Wt Readings from Last 3 Encounters:  05/19/23 163 lb 9.6 oz (74.2 kg)  04/15/23 161 lb (73 kg)  03/24/23 162 lb 14.7 oz (73.9 kg)    GEN: Well nourished, well developed in no acute distress NECK: No JVD; No carotid bruits CARDIAC: RRR, , soft systolic murmur  RESPIRATORY:  Clear to auscultation without rales, wheezing or rhonchi  ABDOMEN: Soft, non-tender, non-distended EXTREMITIES:  No edema; No deformity   ASSESSMENT AND PLAN: .     HTN:   [name] is seen for follow up of her hypertension.  Her blood pressure is very well-controlled today on amlodipine  but she does have seen some leg edema.  I suspect it is due to the amlodipine  therapy.  She also admits to eating more salt than she should.  Her husband does much of the cooking and likes this eat spicy  food.  Will discontinue the amlodipine .  Will start losartan 50 mg a day.    2.  Palpitations: She has the sensation of palpitations but at that time her heart rate seems to be normal.  I suggest that this might mean that she is dehydrated at times.  Advised her to make sure she stays hydrated.  If she feels like she is dehydrated she will add a liquid IV to her drinking water.  3.  Heart murmur: She has a very soft systolic murmur.  Murmur is probably primary located at the upper left sternal border.  There is not any significant radiation to her left axillary line.  Get an echo to further define her heart murmur and cardiac function.  I would like for her to restart her exercise program.  She will make sure that she is not eating excessive salt.  She will keep hydrated and eat a balanced diet.  Will see her back on June 23 for follow-up visit.        Dispo: 4-6 weeks    Signed, Ahmad Alert, MD

## 2023-06-10 ENCOUNTER — Ambulatory Visit: Admitting: Cardiology

## 2023-06-15 ENCOUNTER — Other Ambulatory Visit: Payer: Self-pay | Admitting: Physician Assistant

## 2023-06-15 ENCOUNTER — Ambulatory Visit: Payer: Self-pay

## 2023-06-15 NOTE — Telephone Encounter (Signed)
 FYI, see message.

## 2023-06-15 NOTE — Telephone Encounter (Signed)
 FYI Only or Action Required?: FYI only for provider  Patient was last seen in primary care on 04/15/2023 by Alexander Iba, PA. Called Nurse Triage reporting Diarrhea. Symptoms began today. Interventions attempted: Rest, hydration, or home remedies. Symptoms are: stable.  Triage Disposition: Home Care  Patient/caregiver understands and will follow disposition?: Yes             Copied From CRM [id number]. Reason for Triage: 157/97 BP along with diarrhea, as of this morning. On bp meds as well.   Reason for Disposition  MILD-MODERATE diarrhea (e.g., 1-6 times / day more than normal)  Answer Assessment - Initial Assessment Questions 1. DIARRHEA SEVERITY: "How bad is the diarrhea?" "How many more stools have you had in the past 24 hours than normal?"    - NO DIARRHEA (SCALE 0)   - MILD (SCALE 1-3): Few loose or mushy BMs; increase of 1-3 stools over normal daily number of stools; mild increase in ostomy output.   -  MODERATE (SCALE 4-7): Increase of 4-6 stools daily over normal; moderate increase in ostomy output.   -  SEVERE (SCALE 8-10; OR "WORST POSSIBLE"): Increase of 7 or more stools daily over normal; moderate increase in ostomy output; incontinence.     2 2. ONSET: "When did the diarrhea begin?"      Today 3. BM CONSISTENCY: "How loose or watery is the diarrhea?"      Loose, somewhat watery 4. VOMITING: "Are you also vomiting?" If Yes, ask: "How many times in the past 24 hours?"      None 5. ABDOMEN PAIN: "Are you having any abdomen pain?" If Yes, ask: "What does it feel like?" (e.g., crampy, dull, intermittent, constant)      None 6. ABDOMEN PAIN SEVERITY: If present, ask: "How bad is the pain?"  (e.g., Scale 1-10; mild, moderate, or severe)   - MILD (1-3): doesn't interfere with normal activities, abdomen soft and not tender to touch    - MODERATE (4-7): interferes with normal activities or awakens from sleep, abdomen tender to touch    - SEVERE (8-10): excruciating pain,  doubled over, unable to do any normal activities       None 7. ORAL INTAKE: If vomiting, "Have you been able to drink liquids?" "How much liquids have you had in the past 24 hours?"     Eating and drinking normally 8. HYDRATION: "Any signs of dehydration?" (e.g., dry mouth [not just dry lips], too weak to stand, dizziness, new weight loss) "When did you last urinate?"     None 9. EXPOSURE: "Have you traveled to a foreign country recently?" "Have you been exposed to anyone with diarrhea?" "Could you have eaten any food that was spoiled?"     None 10. ANTIBIOTIC USE: "Are you taking antibiotics now or have you taken antibiotics in the past 2 months?"       None 11. OTHER SYMPTOMS: "Do you have any other symptoms?" (e.g., fever, blood in stool)       None  Protocols used: Diarrhea-A-AH

## 2023-06-16 DIAGNOSIS — Z79899 Other long term (current) drug therapy: Secondary | ICD-10-CM | POA: Diagnosis not present

## 2023-06-16 DIAGNOSIS — R42 Dizziness and giddiness: Secondary | ICD-10-CM | POA: Diagnosis not present

## 2023-06-16 DIAGNOSIS — I10 Essential (primary) hypertension: Secondary | ICD-10-CM | POA: Diagnosis not present

## 2023-06-16 DIAGNOSIS — R002 Palpitations: Secondary | ICD-10-CM | POA: Diagnosis not present

## 2023-06-16 LAB — BASIC METABOLIC PANEL WITH GFR
BUN/Creatinine Ratio: 19 (ref 9–23)
BUN: 15 mg/dL (ref 6–24)
CO2: 22 mmol/L (ref 20–29)
Calcium: 9.9 mg/dL (ref 8.7–10.2)
Chloride: 102 mmol/L (ref 96–106)
Creatinine, Ser: 0.77 mg/dL (ref 0.57–1.00)
Glucose: 110 mg/dL — ABNORMAL HIGH (ref 70–99)
Potassium: 4.3 mmol/L (ref 3.5–5.2)
Sodium: 140 mmol/L (ref 134–144)
eGFR: 92 mL/min/{1.73_m2} (ref >59)

## 2023-06-16 NOTE — Telephone Encounter (Signed)
 Pt requesting refill for Lorazepam  0.5 mg tablet. Last OV 04/15/2023.

## 2023-06-17 ENCOUNTER — Encounter: Payer: Self-pay | Admitting: Physician Assistant

## 2023-06-17 ENCOUNTER — Ambulatory Visit: Admitting: Physician Assistant

## 2023-06-17 ENCOUNTER — Ambulatory Visit: Payer: Self-pay | Admitting: Cardiovascular Disease

## 2023-06-17 VITALS — BP 130/88 | HR 97 | Temp 98.2°F | Ht 68.5 in | Wt 160.2 lb

## 2023-06-17 DIAGNOSIS — F411 Generalized anxiety disorder: Secondary | ICD-10-CM

## 2023-06-17 DIAGNOSIS — I10 Essential (primary) hypertension: Secondary | ICD-10-CM

## 2023-06-17 MED ORDER — BUSPIRONE HCL 15 MG PO TABS
15.0000 mg | ORAL_TABLET | Freq: Three times a day (TID) | ORAL | 1 refills | Status: DC | PRN
Start: 2023-06-17 — End: 2023-11-23

## 2023-06-17 NOTE — Progress Notes (Signed)
 [name] is a 53 y.o. female here for a new problem.  History of Present Illness:   Chief Complaint  Patient presents with   c/o Elevated blood pressure    Pt is having elevated blood pressure reading since Monday was 157/97 then highest was 169/104, asymptomatic. Had headache yesterday 154/100 was the highest in the evening. Today at 11:30 144/92.    HPI  Elevated BP Pt complains of elevated blood pressure readings starting Monday 6/9.  Pt reports recent BP readings of 157/97 on Monday 11:00 AM, 154/100 on Tuesday, her highest BP recorded recently was 169/104. Today, she has BP readings of 138/90 at 8:00 AM and 144/92 at 11:30 AM. She reports waking up feeling okay on Monday woke up fine and started doing chores around the house before she started feeling her BP increasing. Per pt, she saw cardiology on May 13 and they want her to get an echocardiogram to confirm mitral valve regurgitation.  Endorses decreased alcohol intake, increased water intake and Liquid IV, and a reliable BP monitor. Denies stress or monitoring her BP beforehand. She notes she takes Ativan  most nights to help her fall asleep and is compliant with her daily Losartan . She states when she does not take Ativan , she does not stay asleep as long. She also notes intolerances with Trazodone , which causes trembling in her extremities. She also has not been taking her hormone replacements recently.  She additionally reports 2 episodes of diarrhea, which has since resolved. She states she typically has a looser stool when she drinks coffee, so she did not drink coffee today to observe the effects. She also states the diarrhea episodes occurred before the elevated BP readings.  Past Medical History:  Diagnosis Date   Allergy    Arthritis    osteo arthritist   GERD (gastroesophageal reflux disease)    OCC - uses Pepcid  PRN    Heart murmur    History of chicken pox    History of recurrent UTIs    PONV  (postoperative nausea and vomiting)    Screening for HIV (human immunodeficiency virus)      Social History   Tobacco Use   Smoking status: Former    Current packs/day: 0.00    Types: Cigarettes    Quit date: 05/03/2003    Years since quitting: 20.1   Smokeless tobacco: Never  Vaping Use   Vaping status: Never Used  Substance Use Topics   Alcohol use: Yes    Comment: social   Drug use: No    Past Surgical History:  Procedure Laterality Date   BREAST LUMPECTOMY WITH RADIOACTIVE SEED LOCALIZATION Left 05/13/2013   Procedure: BREAST LUMPECTOMY WITH RADIOACTIVE SEED LOCALIZATION;  Surgeon: Levert Ready, MD;  Location: [facility];  Service: General;  Laterality: Left;   BREAST SURGERY     breast augmentation 08/1998   DILATION AND CURETTAGE OF UTERUS  02/2009   ENDOMETRIAL BIOPSY  08/1998   MANDIBLE FRACTURE SURGERY     1988   TONSILLECTOMY     1970    Family History  Problem Relation Age of Onset   Arthritis Mother    Depression Mother    Breast cancer Mother    Other Mother        pacemaker   Arthritis Father    COPD Father    Depression Father    Hypertension Father    Colon polyps Father    Prostate cancer Father    Miscarriages / [place]  Sister    Colon polyps Sister    Miscarriages / Stillbirths Sister    Lung cancer Maternal Grandfather    Heart attack Maternal Grandfather    Heart disease Maternal Grandfather    Hyperlipidemia Maternal Grandfather    Birth defects Paternal Grandfather        lung   Cancer Maternal Aunt        breast   Breast cancer Maternal Aunt    Cancer Paternal Aunt        breast   Breast cancer Paternal Aunt    Colon cancer Neg Hx    Esophageal cancer Neg Hx    Rectal cancer Neg Hx    Stomach cancer Neg Hx     Allergies  Allergen Reactions   Sulfa Antibiotics Hives    Current Medications:   Current Outpatient Medications:    azelastine  (ASTELIN ) 0.1 % nasal spray, Place 1 spray into both nostrils 2  (two) times daily. Use in each nostril as directed, Disp: 30 mL, Rfl: 12   busPIRone (BUSPAR) 15 MG tablet, Take 1 tablet (15 mg total) by mouth 3 (three) times daily as needed., Disp: 30 tablet, Rfl: 1   FAMOTIDINE  PO, Take 1 tablet by mouth daily., Disp: , Rfl:    fexofenadine (ALLEGRA) 180 MG tablet, Take 180 mg by mouth as needed., Disp: , Rfl:    LORazepam  (ATIVAN ) 0.5 MG tablet, TAKE 1/2 - 1 TABLET (0.25 - 0.5 MG TOTAL) BY MOUTH TWICE A DAY AS NEEDED FOR ANXIETY, Disp: 30 tablet, Rfl: 1   losartan  (COZAAR ) 50 MG tablet, Take 1 tablet (50 mg total) by mouth daily., Disp: 90 tablet, Rfl: 3   Multiple Vitamin (MULTIVITAMIN) tablet, Take 1 tablet by mouth daily., Disp: , Rfl:    triamcinolone  (KENALOG ) 0.025 % cream, Apply to affected area on face 1-2 times daily, Disp: 80 g, Rfl: 0   valACYclovir (VALTREX) 1000 MG tablet, Take 1,000 mg by mouth as needed., Disp: , Rfl:    Review of Systems:   Negative unless otherwise specified per HPI.  Vitals:   Vitals:   06/17/23 1534 06/17/23 1627  BP: (!) 138/90 130/88  Pulse: 97   Temp: 98.2 F (36.8 C)   TempSrc: Temporal   SpO2: 98%   Weight: 160 lb 4 oz (72.7 kg)   Height: 5' 8.5 (1.74 m)      Body mass index is 24.01 kg/m.  Physical Exam:   Physical Exam Vitals and nursing note reviewed.  Constitutional:      General: She is not in acute distress.    Appearance: She is well-developed. She is not ill-appearing or toxic-appearing.  Cardiovascular:     Rate and Rhythm: Normal rate and regular rhythm.     Pulses: Normal pulses.     Heart sounds: Normal heart sounds, S1 normal and S2 normal.  Pulmonary:     Effort: Pulmonary effort is normal.     Breath sounds: Normal breath sounds.  Skin:    General: Skin is warm and dry.  Neurological:     Mental Status: She is alert.     GCS: GCS eye subscore is 4. GCS verbal subscore is 5. GCS motor subscore is 6.  Psychiatric:        Speech: Speech normal.        Behavior: Behavior  normal. Behavior is cooperative.     Assessment and Plan:   1. Primary hypertension (Primary) Above goal today but on recheck, it is improved  No evidence of end-organ damage on my exam Recommend patient monitor home blood pressure at least a few times weekly Continue losartan  50 mg daily If home monitoring shows consistent elevation, or any symptom(s) develop, recommend reach out to us  for further advice on next steps I discussed with her the need to monitor somewhat at home -- she is going to try to work on this  2. GAD (generalized anxiety disorder) Uncontrolled We discussed concerns with long-term use of frequent benzo use Will trial 5-15 mg buspar three times daily as needed for anxiety I have asked her to let me know if medication is helpful or not in a week or so via MyChart Consider starting celexa 10 mg daily for generalized anxiety disorder and mild depressive symptom(s) if ineffective or needs additional support  She felt best on hormone replacement therapy -- hopefully we can get her blood pressure under control and get this started if gynecology is willing   I, Timoteo Force, acting as a Neurosurgeon for [employer], [place]., have documented all relevant documentation on the behalf of Alexander Iba, [place], as directed by  Alexander Iba, PA while in the presence of Alexander Iba, [place].  I, Alexander Iba, [place], have reviewed all documentation for this visit. The documentation on 06/17/23 for the exam, diagnosis, procedures, and orders are all accurate and complete.  Alexander Iba, PA-C

## 2023-06-23 ENCOUNTER — Ambulatory Visit (HOSPITAL_COMMUNITY)
Admission: RE | Admit: 2023-06-23 | Discharge: 2023-06-23 | Disposition: A | Discharge status: Home / Self Care | Source: Ambulatory Visit | Attending: Cardiovascular Disease | Admitting: Cardiovascular Disease

## 2023-06-23 DIAGNOSIS — Z79899 Other long term (current) drug therapy: Secondary | ICD-10-CM | POA: Diagnosis not present

## 2023-06-23 DIAGNOSIS — I10 Essential (primary) hypertension: Secondary | ICD-10-CM | POA: Diagnosis not present

## 2023-06-23 DIAGNOSIS — R002 Palpitations: Secondary | ICD-10-CM

## 2023-06-23 DIAGNOSIS — R42 Dizziness and giddiness: Secondary | ICD-10-CM

## 2023-06-23 LAB — ECHOCARDIOGRAM COMPLETE
Area-P 1/2: 3.77 cm2
S' Lateral: 2.3 cm

## 2023-06-25 NOTE — Progress Notes (Unsigned)
  Cardiology Office Note:  .   Date:  06/25/2023  ID:  [name], DOB [birth year], MRN [id number] PCP: Alexander Iba, PA  [facility] Providers Cardiologist:  None    History of Present Illness: .   [name] is a 53 y.o. female with HTN  She is a Runner, broadcasting/film/video,  felt very dizzy one day in class Was very tachy ( 140)  Hypertensive  BP eventually came down, treated with Ativan    She has stopped her estrogen and progestogen .  Was started on amlodipine  in march after a 2nd ER visit   She feels like her HR is racing but the rate is well controlled.   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 GM - CHF Mother pacer, HTN Father  - COPD, cancer     June 23 ,2025  [name] is seen for follow up of her orthostatic hypotension Soft systolic murmur  We stopped amlodipine  and started Losartan  at her last office visit  Her echo from June 17 shows normal LV systolic function with EF 60-65% Normal diastolic function  Triial MR Trivial AI     ROS:   Studies Reviewed: .         Risk Assessment/Calculations:           Physical Exam:    Physical Exam: Last menstrual period 05/27/2021.  No BP recorded.  {Refresh Note OR Click here to enter BP  :1}***    GEN:  Well nourished, well developed in no acute distress HEENT: Normal NECK: No JVD; No carotid bruits LYMPHATICS: No lymphadenopathy CARDIAC: RRR ***, no murmurs, rubs, gallops RESPIRATORY:  Clear to auscultation without rales, wheezing or rhonchi  ABDOMEN: Soft, non-tender, non-distended MUSCULOSKELETAL:  No edema; No deformity  SKIN: Warm and dry NEUROLOGIC:  Alert and oriented x 3   ASSESSMENT AND PLAN: .     HTN:   [name] is seen for follow up of her hypertension.  Her blood  pressure is very well-controlled today on amlodipine  but she does have seen some leg edema.  I suspect it is due to the amlodipine  therapy.  She also admits to eating more salt than she should.  Her husband does much of the cooking and likes this eat spicy food.  Will discontinue the amlodipine .  Will start losartan  50 mg a day.    ***  2.  Palpitations:    3.  Heart murmur: She has a very soft systolic murmur.    I would like for her to restart her exercise program.  She will make sure that she is not eating excessive salt.  She will keep hydrated and eat a balanced diet.  Will see her back on June 23 for follow-up visit.        Dispo:     Signed, Ahmad Alert, MD

## 2023-06-29 ENCOUNTER — Encounter: Payer: Self-pay | Admitting: Cardiovascular Disease

## 2023-06-29 ENCOUNTER — Ambulatory Visit: Attending: Cardiovascular Disease | Admitting: Cardiovascular Disease

## 2023-06-29 VITALS — BP 139/80 | HR 76 | Ht 68.5 in | Wt 159.6 lb

## 2023-06-29 DIAGNOSIS — R002 Palpitations: Secondary | ICD-10-CM | POA: Diagnosis not present

## 2023-06-29 DIAGNOSIS — I10 Essential (primary) hypertension: Secondary | ICD-10-CM | POA: Diagnosis not present

## 2023-06-29 MED ORDER — PROPRANOLOL HCL 10 MG PO TABS: 10.0000 mg | ORAL_TABLET | Freq: Four times a day (QID) | ORAL | 0 refills | Status: DC

## 2023-06-29 MED ORDER — NEBIVOLOL HCL 5 MG PO TABS: 5.0000 mg | ORAL_TABLET | Freq: Every day | ORAL | 3 refills | Status: AC

## 2023-06-29 NOTE — Patient Instructions (Signed)
 Medication Instructions:  START Bystolic (nevibolol) 5 mg daily TAKE Propanolol four times daily as needed   *If you need a refill on your cardiac medications before your next appointment, please call your pharmacy*   Follow-Up: At [facility], you and your health needs are our priority.  As part of our continuing mission to provide you with exceptional heart care, our providers are all part of one team.  This team includes your primary Cardiologist (physician) and Advanced Practice Providers or APPs (Physician Assistants and Nurse Practitioners) who all work together to provide you with the care you need, when you need it.  Your next appointment:   AS SCHEDULED   Provider:   Rosaline Bane, NP

## 2023-07-21 ENCOUNTER — Other Ambulatory Visit: Payer: Self-pay | Admitting: Cardiovascular Disease

## 2023-08-07 ENCOUNTER — Encounter (HOSPITAL_BASED_OUTPATIENT_CLINIC_OR_DEPARTMENT_OTHER): Payer: Self-pay | Admitting: Nurse Practitioner

## 2023-09-25 ENCOUNTER — Ambulatory Visit (HOSPITAL_BASED_OUTPATIENT_CLINIC_OR_DEPARTMENT_OTHER): Admitting: Nurse Practitioner

## 2023-10-19 DIAGNOSIS — Z6825 Body mass index (BMI) 25.0-25.9, adult: Secondary | ICD-10-CM | POA: Diagnosis not present

## 2023-10-19 DIAGNOSIS — Z01419 Encounter for gynecological examination (general) (routine) without abnormal findings: Secondary | ICD-10-CM | POA: Diagnosis not present

## 2023-11-23 ENCOUNTER — Ambulatory Visit: Attending: Nurse Practitioner

## 2023-11-23 ENCOUNTER — Ambulatory Visit (HOSPITAL_BASED_OUTPATIENT_CLINIC_OR_DEPARTMENT_OTHER): Admitting: Nurse Practitioner

## 2023-11-23 ENCOUNTER — Other Ambulatory Visit (HOSPITAL_BASED_OUTPATIENT_CLINIC_OR_DEPARTMENT_OTHER): Payer: Self-pay | Admitting: Nurse Practitioner

## 2023-11-23 VITALS — BP 120/82 | HR 67 | Ht 69.0 in | Wt 166.1 lb

## 2023-11-23 DIAGNOSIS — I10 Essential (primary) hypertension: Secondary | ICD-10-CM | POA: Diagnosis not present

## 2023-11-23 DIAGNOSIS — Z7189 Other specified counseling: Secondary | ICD-10-CM | POA: Diagnosis not present

## 2023-11-23 DIAGNOSIS — R42 Dizziness and giddiness: Secondary | ICD-10-CM | POA: Diagnosis not present

## 2023-11-23 DIAGNOSIS — Z79899 Other long term (current) drug therapy: Secondary | ICD-10-CM

## 2023-11-23 DIAGNOSIS — R002 Palpitations: Secondary | ICD-10-CM | POA: Diagnosis not present

## 2023-11-23 NOTE — Progress Notes (Unsigned)
 Enrolled for [employer] to mail a ZIO XT long term holter monitor to the patients address on file.   DOD to read.

## 2023-11-23 NOTE — Progress Notes (Unsigned)
  Cardiology Office Note   Date:  11/23/2023  ID:  [name], DOB Jan 16, 1970, MRN [id number] PCP: Job Lukes, PA  [facility] Providers Cardiologist:  None { Click to update primary MD,subspecialty MD or APP then REFRESH:1}    PMH Hypertension Tachycardia Murmur Family history of Heart disease Maternal grandmother - multiple heart attacks Mother has pacemaker, living age 54  Referred to cardiology and seen by Dr. Alveta on 05/19/2023.  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  Was very tachycardic with HR at 140 bpm, BP was elevated.  She was treated with Ativan .  She had recently stopped her estrogen and progesterone.  ED visit March 2025, was started on amlodipine .  She reported feeling like her heart is racing but HR in clinic was well-controlled.  She was exercising in the past with weights and treadmill but had not been as active recently.  CAC score of 0 in April 2023.  Her husband does the cooking and generally seasons the food very heavily.  She noted leg edema since starting amlodipine .  Amlodipine  was stopped and losartan  50 mg daily was started. Murmur was noted on exam.  Echocardiogram 06/23/2023 revealed normal LVEF 60 to 65%, normal diastolic parameters, normal RV, no significant valve disease.  Seen back in the office by Dr. Alveta on 06/29/2023.  She had lost 5 pounds since previous visit walking 3 days a week, and drinking less alcohol.  She continued to have palpitations and feeling her heart racing despite normal HR.  Bystolic  5 mg daily was added and she was given propranolol  10 mg to take up to 4 times daily as needed for breakthrough palpitations.  History of Present Illness [name] is a 53 y.o. female ***  Average HR 60s-70 50s at times  Works as a runner, broadcasting/film/video   ROS: ***  Studies Reviewed      ***  No results found for: LIPOA  Risk Assessment/Calculations           Physical Exam VS:  BP 120/82 (BP Location: Left Arm,  Patient Position: Sitting, Cuff Size: Normal)   Pulse 67   Ht 5' 9 (1.753 m)   Wt 166 lb 1.6 oz (75.3 kg)   LMP 05/27/2021 (Exact Date)   SpO2 98%   BMI 24.53 kg/m    Wt Readings from Last 3 Encounters:  11/23/23 166 lb 1.6 oz (75.3 kg)  06/29/23 159 lb 9.6 oz (72.4 kg)  06/17/23 160 lb 4 oz (72.7 kg)    GEN: Well nourished, well developed in no acute distress NECK: No JVD; No carotid bruits CARDIAC: ***RRR, no murmurs, rubs, gallops RESPIRATORY:  Clear to auscultation without rales, wheezing or rhonchi  ABDOMEN: Soft, non-tender, non-distended EXTREMITIES:  No edema; No deformity   ASSESSMENT AND PLAN ***    {Are you ordering a CV Procedure (e.g. stress test, cath, DCCV, TEE, etc)?   Press F2        :[id number]}  Dispo: ***  Signed, Rosaline Bane, NP-C

## 2023-11-23 NOTE — Patient Instructions (Signed)
 Medication Instructions:   Your physician recommends that you continue on your current medications as directed. Please refer to the Current Medication list given to you today.   *If you need a refill on your cardiac medications before your next appointment, please call your pharmacy*  Lab Work:  TODAY!!! MAG/CMET/TSH  If you have labs (blood work) drawn today and your tests are completely normal, you will receive your results only by: MyChart Message (if you have MyChart) OR A paper copy in the mail If you have any lab test that is abnormal or we need to change your treatment, we will call you to review the results.  Testing/Procedures:  - DO NOT TAKE YOUR ??? MEDICATION THE MORNING OF THE STRESS TEST.  BRING IT WITH YOU TO YOUR APPOINTMENT. - DO NOT EAT, DRINK, OR USE TOBACCO PRODUCTS WITHIN 4 HOURS OF THE TEST - DRESS PREPARED TO EXERCISE IN A COMFORTABLE, 2 PIECE CLOTHING OUTFIT AND WALKING SHOES - BRING ANY PRESCRIPTION MEDICATIONS WITH YOU - NOTIFY THE OFFICE 24 HOURS IN ADVANCE IF YOU NEED TO CANCEL - CALL THE OFFICE [phone] IF YOU HAVE ANY QUESTIONS    Follow-Up: At [facility], you and your health needs are our priority.  As part of our continuing mission to provide you with exceptional heart care, our providers are all part of one team.  This team includes your primary Cardiologist (physician) and Advanced Practice Providers or APPs (Physician Assistants and Nurse Practitioners) who all work together to provide you with the care you need, when you need it.  ZIO XT- Long Term Monitor Instructions  Your physician has requested you wear a ZIO patch monitor for 14 days.  This is a single patch monitor. [employer] supplies one patch monitor per enrollment. Additional stickers are not available. Please do not apply patch if you will be having a Nuclear Stress Test,  Echocardiogram, Cardiac CT, MRI, or Chest Xray during the period you would be wearing the  monitor. The  patch cannot be worn during these tests. You cannot remove and re-apply the  ZIO XT patch monitor.  Your ZIO patch monitor will be mailed 3 day USPS to your address on file. It may take 3-5 days  to receive your monitor after you have been enrolled.  Once you have received your monitor, please review the enclosed instructions. Your monitor  has already been registered assigning a specific monitor serial # to you.  Billing and Patient Assistance Program Information  We have supplied [employer] with any of your insurance information on file for billing purposes. [employer] offers a sliding scale Patient Assistance Program for patients that do not have  insurance, or whose insurance does not completely cover the cost of the ZIO monitor.  You must apply for the Patient Assistance Program to qualify for this discounted rate.  To apply, please call [employer] at [phone], select option 4, select option 2, ask to apply for  Patient Assistance Program. Meredeth will ask your household income, and how many people  are in your household. They will quote your out-of-pocket cost based on that information.  [employer] will also be able to set up a 108-month, interest-free payment plan if needed.  Applying the monitor   Shave hair from upper left chest.  Hold abrader disc by orange tab. Rub abrader in 40 strokes over the upper left chest as  indicated in your monitor instructions.  Clean area with 4 enclosed alcohol pads. Let dry.  Apply patch as indicated in  monitor instructions. Patch will be placed under collarbone on left  side of chest with arrow pointing upward.  Rub patch adhesive wings for 2 minutes. Remove white label marked 1. Remove the white  label marked 2. Rub patch adhesive wings for 2 additional minutes.  While looking in a mirror, press and release button in center of patch. A small green light will  flash 3-4 times. This will be your only indicator that the monitor has been turned on.  Do  not shower for the first 24 hours. You may shower after the first 24 hours.  Press the button if you feel a symptom. You will hear a small click. Record Date, Time and  Symptom in the Patient Logbook.  When you are ready to remove the patch, follow instructions on the last 2 pages of Patient  Logbook. Stick patch monitor onto the last page of Patient Logbook.  Place Patient Logbook in the blue and white box. Use locking tab on box and tape box closed  securely. The blue and white box has prepaid postage on it. Please place it in the mailbox as  soon as possible. Your physician should have your test results approximately 7 days after the  monitor has been mailed back to [facility].  Call [facility] Customer Care at [phone] if you have questions regarding  your ZIO XT patch monitor. Call them immediately if you see an orange light blinking on your  monitor.  If your monitor falls off in less than 4 days, contact our Monitor department at [phone].  If your monitor becomes loose or falls off after 4 days call [employer] at [phone] for  suggestions on securing your monitor  Your next appointment:   3 month(s)  Provider:   Rosaline Bane, NP    We recommend signing up for the patient portal called MyChart.  Sign up information is provided on this After Visit Summary.  MyChart is used to connect with patients for Virtual Visits (Telemedicine).  Patients are able to view lab/test results, encounter notes, upcoming appointments, etc.  Non-urgent messages can be sent to your provider as well.   To learn more about what you can do with MyChart, go to [email or web address].   Other Instructions  Adopting a Healthy Lifestyle.   Weight: Know what a healthy weight is for you (roughly BMI <25) and aim to maintain this. You can calculate your body mass index on your smart phone. Unfortunately, this is not the most accurate measure of healthy weight, but it is the simplest  measurement to use. A more accurate measurement involves body scanning which measures lean muscle, fat tissue and bony density. We do not have this equipment at [facility].    Diet: Aim for 7+ servings of fruits and vegetables daily Limit animal fats in diet for cholesterol and heart health - choose grass fed whenever available Avoid highly processed foods (fast food burgers, tacos, fried chicken, pizza, hot dogs, french fries)  Saturated fat comes in the form of butter, lard, coconut oil, margarine, partially hydrogenated oils, dairy products, and fat in meat. These increase your risk of cardiovascular disease.  Use healthy plant oils, such as olive, canola, soy, corn, sunflower and peanut.  Whole foods such as fruits, vegetables and whole grains have fiber  Men need > 38 grams of fiber per day Women need > 25 grams of fiber per day  Load up on vegetables and fruits - one-half of your plate: Aim for color and variety, and remember  that potatoes dont count. Go for whole grains - one-quarter of your plate: Whole wheat, barley, wheat berries, quinoa, oats, brown rice, and foods made with them. If you want pasta, go with whole wheat pasta. Protein power - one-quarter of your plate: Fish, chicken, beans, and nuts are all healthy, versatile protein sources. Limit red meat. You need carbohydrates for energy! The type of carbohydrate is more important than the amount. Choose carbohydrates such as vegetables, fruits, whole grains, beans, and nuts in the place of white rice, white pasta, potatoes (baked or fried), macaroni and cheese, cakes, cookies, and donuts.  If youre thirsty, drink water. Coffee and tea are good in moderation, but skip sugary drinks and limit milk and dairy products to one or two daily servings. Keep sugar intake at 6 teaspoons or 24 grams or LESS       Exercise: Aim for 150 min of moderate intensity exercise weekly for heart health, and weights twice weekly for bone health Stay  active - any steps are better than no steps! Aim for 7-9 hours of sleep daily   Sleep: This provides your body with the reset and relaxation that it needs!  Aim to get 7-8 hours of sleep each night. Limit caffeine, screen time, and other distractions prior to bedtime.  Keep your bedroom cool and dark and do not wear heavy clothing to bed or use heavy bed covers - layer if needed.

## 2023-11-24 ENCOUNTER — Other Ambulatory Visit (HOSPITAL_BASED_OUTPATIENT_CLINIC_OR_DEPARTMENT_OTHER): Payer: Self-pay | Admitting: *Deleted

## 2023-11-24 ENCOUNTER — Encounter (HOSPITAL_BASED_OUTPATIENT_CLINIC_OR_DEPARTMENT_OTHER): Payer: Self-pay | Admitting: Nurse Practitioner

## 2023-11-24 ENCOUNTER — Ambulatory Visit (HOSPITAL_BASED_OUTPATIENT_CLINIC_OR_DEPARTMENT_OTHER): Payer: Self-pay | Admitting: Nurse Practitioner

## 2023-11-24 DIAGNOSIS — R002 Palpitations: Secondary | ICD-10-CM

## 2023-11-24 DIAGNOSIS — Z79899 Other long term (current) drug therapy: Secondary | ICD-10-CM

## 2023-11-24 DIAGNOSIS — Z7189 Other specified counseling: Secondary | ICD-10-CM

## 2023-11-24 LAB — COMPREHENSIVE METABOLIC PANEL WITH GFR
ALT: 14 IU/L (ref 0–32)
AST: 17 IU/L (ref 0–40)
Albumin: 4.5 g/dL (ref 3.8–4.9)
Alkaline Phosphatase: 63 IU/L (ref 49–135)
BUN/Creatinine Ratio: 24 — ABNORMAL HIGH (ref 9–23)
BUN: 19 mg/dL (ref 6–24)
Bilirubin Total: 0.6 mg/dL (ref 0.0–1.2)
CO2: 19 mmol/L — ABNORMAL LOW (ref 20–29)
Calcium: 9.4 mg/dL (ref 8.7–10.2)
Chloride: 105 mmol/L (ref 96–106)
Creatinine, Ser: 0.79 mg/dL (ref 0.57–1.00)
Globulin, Total: 2.2 g/dL (ref 1.5–4.5)
Glucose: 99 mg/dL (ref 70–99)
Potassium: 5 mmol/L (ref 3.5–5.2)
Sodium: 138 mmol/L (ref 134–144)
Total Protein: 6.7 g/dL (ref 6.0–8.5)
eGFR: 89 mL/min/1.73 (ref >59)

## 2023-11-24 LAB — MAGNESIUM: Magnesium: 1.8 mg/dL (ref 1.6–2.3)

## 2023-11-24 LAB — TSH: TSH: 0.743 u[IU]/mL (ref 0.450–4.500)

## 2023-11-27 ENCOUNTER — Telehealth (HOSPITAL_COMMUNITY): Payer: Self-pay | Admitting: *Deleted

## 2023-11-27 NOTE — Telephone Encounter (Signed)
 Spoke to patient as a reminder about her ETT on 12/01/23 at 1:00. Patient also stated that she was told to take her beta blockers as usual because she takes them at night.

## 2023-12-01 ENCOUNTER — Ambulatory Visit (HOSPITAL_COMMUNITY)
Admission: RE | Admit: 2023-12-01 | Discharge: 2023-12-01 | Disposition: A | Discharge status: Home / Self Care | Source: Ambulatory Visit | Attending: Internal Medicine | Admitting: Internal Medicine

## 2023-12-01 DIAGNOSIS — Z7189 Other specified counseling: Secondary | ICD-10-CM | POA: Insufficient documentation

## 2023-12-01 DIAGNOSIS — I10 Essential (primary) hypertension: Secondary | ICD-10-CM | POA: Diagnosis not present

## 2023-12-01 DIAGNOSIS — R002 Palpitations: Secondary | ICD-10-CM | POA: Insufficient documentation

## 2023-12-01 DIAGNOSIS — R42 Dizziness and giddiness: Secondary | ICD-10-CM | POA: Insufficient documentation

## 2023-12-01 LAB — EXERCISE TOLERANCE TEST
Angina Index: 0
Estimated workload: 8.2
Exercise duration (min): 6 min
Exercise duration (sec): 51 s
MPHR: 166 {beats}/min
Peak HR: 160 {beats}/min
Percent HR: 96 %
Rest HR: 60 {beats}/min

## 2023-12-02 ENCOUNTER — Inpatient Hospital Stay (HOSPITAL_COMMUNITY): Admission: RE | Admit: 2023-12-02 | Source: Ambulatory Visit

## 2023-12-02 DIAGNOSIS — Z79899 Other long term (current) drug therapy: Secondary | ICD-10-CM | POA: Diagnosis not present

## 2023-12-02 DIAGNOSIS — R002 Palpitations: Secondary | ICD-10-CM | POA: Diagnosis not present

## 2023-12-02 LAB — LIPID PANEL
Chol/HDL Ratio: 3.7 ratio (ref 0.0–4.4)
Cholesterol, Total: 246 mg/dL — ABNORMAL HIGH (ref 100–199)
HDL: 66 mg/dL (ref >39)
LDL Chol Calc (NIH): 164 mg/dL — ABNORMAL HIGH (ref 0–99)
Triglycerides: 92 mg/dL (ref 0–149)
VLDL Cholesterol Cal: 16 mg/dL (ref 5–40)

## 2023-12-14 DIAGNOSIS — L814 Other melanin hyperpigmentation: Secondary | ICD-10-CM | POA: Diagnosis not present

## 2023-12-14 DIAGNOSIS — L821 Other seborrheic keratosis: Secondary | ICD-10-CM | POA: Diagnosis not present

## 2023-12-14 DIAGNOSIS — L570 Actinic keratosis: Secondary | ICD-10-CM | POA: Diagnosis not present

## 2023-12-14 DIAGNOSIS — L718 Other rosacea: Secondary | ICD-10-CM | POA: Diagnosis not present

## 2023-12-14 DIAGNOSIS — L738 Other specified follicular disorders: Secondary | ICD-10-CM | POA: Diagnosis not present

## 2023-12-14 MED ORDER — ROSUVASTATIN CALCIUM 5 MG PO TABS: 5.0000 mg | ORAL_TABLET | Freq: Every day | ORAL | 3 refills | Status: AC

## 2023-12-14 NOTE — Addendum Note (Signed)
 Addended by: Cashe Gatt on: 12/14/2023 05:53 PM   Modules accepted: Orders

## 2023-12-30 DIAGNOSIS — Z79899 Other long term (current) drug therapy: Secondary | ICD-10-CM

## 2023-12-30 DIAGNOSIS — R002 Palpitations: Secondary | ICD-10-CM | POA: Diagnosis not present

## 2023-12-30 DIAGNOSIS — R42 Dizziness and giddiness: Secondary | ICD-10-CM

## 2024-01-02 ENCOUNTER — Ambulatory Visit (HOSPITAL_BASED_OUTPATIENT_CLINIC_OR_DEPARTMENT_OTHER): Payer: Self-pay | Admitting: Nurse Practitioner

## 2024-02-10 NOTE — Progress Notes (Unsigned)
 " Cardiology Office Note   Date:  02/12/2024  ID:  [name], DOB [birth year], MRN [id number] PCP: Rhonda Lukes, PA  [facility] Providers Cardiologist:  None Cardiology APP:  [name] HERO, NP     PMH Hypertension Tachycardia Murmur Family history of Heart disease Maternal grandmother - multiple heart attacks Mother has pacemaker, living age 55  Referred to cardiology and seen by Dr. Alveta on 05/19/2023.  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  Was very tachycardic with HR at 140 bpm, BP was elevated.  She was treated with Ativan .  She had recently stopped her estrogen and progesterone.  ED visit March 2025, was started on amlodipine .  She reported feeling like her heart is racing but HR in clinic was well-controlled.  She was exercising in the past with weights and treadmill but had not been as active recently.  CAC score of 0 in April 2023.  Her husband does the cooking and generally seasons the food very heavily.  She noted leg edema since starting amlodipine .  Amlodipine  was stopped and losartan  50 mg daily was started. Murmur was noted on exam.  Echocardiogram 06/23/2023 revealed normal LVEF 60 to 65%, normal diastolic parameters, normal RV, no significant valve disease.  Seen back in the office by Dr. Alveta on 06/29/2023.  She had lost 5 pounds since previous visit walking 3 days a week, and drinking less alcohol.  She continued to have palpitations and feeling her heart racing despite normal HR.  Bystolic  5 mg daily was added and she was given propranolol  10 mg to take up to 4 times daily as needed for breakthrough palpitations.  Seen by me on 11/23/23 for heart palpitations and lightheadedness. Experiencing heart palpitations and lightheadedness since March, with episodes lasting 20-30 minutes, sometimes accompanied by a feeling of panic. Symptoms initially improved on beta-blocker, but she had significant symptoms during a recent trip to [facility].  Started to not feel right and symptoms resolved after 10 minutes of rest. Later, when walking at a fast pace with her friend, she had increased shortness of breath and dizziness. She did not have to stop but needed time to recover afterwards. Has propranolol  to take as needed but is hesitant to use it due to concerns about bradycardia. HR ranges from 50-70 bpm, occasionally dipping into the 50s. No significant events recorded on Apple Watch. Family history includes heart disease, with her maternal grandmother having multiple heart attacks and her mother, who is living at age 32, has a pacemaker. No history of thyroid  disease. BP generally well controlled. Has backed off exercise given increased symptoms with exertion.  Labs revealed no abnormal thyroid  function, normal renal function and electrolytes and stable liver function.  Magnesium level was 1.8 with goal of 2 or higher, she was advised to start magnesium 3 and 8 or glycan eight 1 capsule at bedtime.  ETT 12/01/2023 revealed adequate exercise capacity and no evidence of ischemia.  14-day ZIO revealed predominant sinus rhythm with average HR 63 bpm, no sustained arrhythmia, less than 1% atrial and ventricular ectopy.   Lipid panel completed 12/02/2023 revealed total cholesterol 246, triglycerides 92, HDL 66, and LDL 164.  She was advised to start rosuvastatin  5 mg daily and have repeat lipid testing in 2 to 3 months.  History of Present Illness  Discussed the use of AI scribe software for clinical note transcription with the patient, who gave verbal consent to proceed.  History of Present Illness  [name] is a very pleasant 54 year old female who presents for follow-up of palpitations. She reports occasional palpitations that overall have improved. No significant episodes like she reported at previous office visit. Lightheadedness occurs intermittently and she thinks it may be secondary to dehydration. She took propranolol  once during a  recent episode of palpitations with good relief. Intermittent chest discomfort described as heartburn-like discomfort without acid sensation. It is not daily, sometimes follows meals, and often improves with water. She is postmenopausal and uses hormone replacement therapy with testosterone cream, progesterone, and an estradiol patch. Hot flashes and irritability have improved, but she still feels fatigued and low in motivation.  She is tolerating rosuvastatin  without any concerning side effects.  BP has generally been well-controlled. 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   ROS: See HPI  Studies Reviewed EKG Interpretation Date/Time:  Thursday February 11 2024 15:38:13 EST Ventricular Rate:  60 PR Interval:  170 QRS Duration:  86 QT Interval:  392 QTC Calculation: 392 R Axis:   55  Text Interpretation: Normal sinus rhythm with sinus arrhythmia Possible Left atrial enlargement When compared with ECG of 24-Mar-2023 13:09, No significant change was found Confirmed by [name] [phone]) on 02/11/2024 3:43:34 PM   CT Calcium  Score 04/16/2021 Ascending Aorta: Normal caliber.   Pericardium: Normal.   Coronary arteries: Normal origins.   Coronary Calcium  Score:   Left main: 0   Left anterior descending artery: 0   Left circumflex artery: 0   Right coronary artery: 0   Total: 0   Percentile: 1st for age, sex, and race matched control.   IMPRESSION: 1. Coronary calcium  score of 0. This was 1st percentile for age, gender, and race matched controls.  No results found for: LIPOA  Risk Assessment/Calculations           Physical Exam VS:  BP 118/60   Pulse 60   Ht 5' 9 (1.753 m)   Wt 169 lb (76.7 kg)   LMP 05/27/2021   SpO2 99%   BMI 24.96 kg/m    Wt Readings from Last 3 Encounters:  02/11/24 169 lb (76.7 kg)  11/23/23 166 lb 1.6 oz (75.3 kg)  06/29/23 159 lb  9.6 oz (72.4 kg)    GEN: Well nourished, well developed in no acute distress NECK: No JVD; No carotid bruits CARDIAC: RRR, no murmurs, rubs, gallops RESPIRATORY:  Clear to auscultation without rales, wheezing or rhonchi  ABDOMEN: Soft, non-tender, non-distended EXTREMITIES:  No edema; No deformity    Assessment & Plan Palpitations  Dizziness Intermittent palpitations have improved recently.  Rare use of propranolol  but she does find it helpful.  We reviewed cardiac monitor that showed no significant arrhythmias.  ETT and echocardiogram both normal.   - Continue propranolol  10 mg up to 4 times daily as needed -Ensure adequate hydration and limit caffeine -Resume regular exercise aiming for at least 150 minutes of moderate intensity exercise each week - Heart healthy diet avoiding processed foods, saturated fat, sugar, and other simple carbohydrates encouraged  Essential hypertension   BP initially elevated at 142/82 but significantly improved on my recheck to 108/58 in left arm and 118/60 in right arm. Admits she was rushing to get here.  No change in antihypertensive therapy today.  Renal function stable on labs completed 11/23/2023. - Continue Bystolic  and losartan  - Routine home blood pressure monitoring  Cardiac risk Dyslipidemia CT  calcium  score of 0 on 04/16/2021. She denies chest pain, dyspnea, or other symptoms concerning for angina. No indication for further ischemic evaluation at this time.  Lipid panel 12/02/2023 with total cholesterol 246, triglycerides 92, HDL 66, and LDL-C 164.  She was advised to start rosuvastatin  5 mg daily to aim for LDL < 100.  She is tolerating rosuvastatin  without concerning side effects.  We will recheck lipids when she can return fasting. - Recheck lipid/LFT - Continue rosuvastatin  5 mg daily  - Heart healthy diet and regular exercise encouraged           Dispo: 1 year with me  Signed, Huynh Bane, NP-C "

## 2024-02-11 ENCOUNTER — Other Ambulatory Visit (HOSPITAL_BASED_OUTPATIENT_CLINIC_OR_DEPARTMENT_OTHER): Payer: Self-pay | Admitting: *Deleted

## 2024-02-11 ENCOUNTER — Encounter (HOSPITAL_BASED_OUTPATIENT_CLINIC_OR_DEPARTMENT_OTHER): Payer: Self-pay | Admitting: Nurse Practitioner

## 2024-02-11 ENCOUNTER — Ambulatory Visit (HOSPITAL_BASED_OUTPATIENT_CLINIC_OR_DEPARTMENT_OTHER): Admitting: Nurse Practitioner

## 2024-02-11 VITALS — BP 142/82 | HR 60 | Ht 69.0 in | Wt 169.0 lb

## 2024-02-11 DIAGNOSIS — Z7189 Other specified counseling: Secondary | ICD-10-CM | POA: Diagnosis not present

## 2024-02-11 DIAGNOSIS — I10 Essential (primary) hypertension: Secondary | ICD-10-CM

## 2024-02-11 DIAGNOSIS — R42 Dizziness and giddiness: Secondary | ICD-10-CM | POA: Diagnosis not present

## 2024-02-11 DIAGNOSIS — R002 Palpitations: Secondary | ICD-10-CM

## 2024-02-11 DIAGNOSIS — E785 Hyperlipidemia, unspecified: Secondary | ICD-10-CM | POA: Diagnosis not present

## 2024-02-11 DIAGNOSIS — Z79899 Other long term (current) drug therapy: Secondary | ICD-10-CM

## 2024-02-11 NOTE — Patient Instructions (Signed)
 Medication Instructions:   Your physician recommends that you continue on your current medications as directed. Please refer to the Current Medication list given to you today.   *If you need a refill on your cardiac medications before your next appointment, please call your pharmacy*  Lab Work:  Your physician recommends that you return for a FASTING lipid profile/alt, fasting after midnight. Patient given paperwork today.   If you have labs (blood work) drawn today and your tests are completely normal, you will receive your results only by: MyChart Message (if you have MyChart) OR A paper copy in the mail If you have any lab test that is abnormal or we need to change your treatment, we will call you to review the results.  Testing/Procedures:  None ordered.   Follow-Up: At [facility], you and your health needs are our priority.  As part of our continuing mission to provide you with exceptional heart care, our providers are all part of one team.  This team includes your primary Cardiologist (physician) and Advanced Practice Providers or APPs (Physician Assistants and Nurse Practitioners) who all work together to provide you with the care you need, when you need it.  Your next appointment:   1 year(s)  Provider:   Rosaline Bane, NP    We recommend signing up for the patient portal called MyChart.  Sign up information is provided on this After Visit Summary.  MyChart is used to connect with patients for Virtual Visits (Telemedicine).  Patients are able to view lab/test results, encounter notes, upcoming appointments, etc.  Non-urgent messages can be sent to your provider as well.   To learn more about what you can do with MyChart, go to [email or web address].   Other Instructions  Your physician wants you to follow-up in: 1 year.  You will receive a reminder letter in the mail two months in advance. If you don't receive a letter, please call our office to schedule  the follow-up appointment.
